# Patient Record
Sex: Male | Born: 1980 | Race: White | Hispanic: No | Marital: Single | Smoking: Current every day smoker
Health system: Southern US, Community
[De-identification: ages and names within clinical notes are randomized; demographics above are authoritative.]

## PROBLEM LIST (undated history)

## (undated) DIAGNOSIS — R042 Hemoptysis: Secondary | ICD-10-CM

## (undated) DIAGNOSIS — J449 Chronic obstructive pulmonary disease, unspecified: Secondary | ICD-10-CM

## (undated) DIAGNOSIS — Z8614 Personal history of Methicillin resistant Staphylococcus aureus infection: Secondary | ICD-10-CM

## (undated) DIAGNOSIS — Z8489 Family history of other specified conditions: Secondary | ICD-10-CM

## (undated) HISTORY — PX: HERNIA REPAIR: SHX51

---

## 2003-05-14 DIAGNOSIS — Z8614 Personal history of Methicillin resistant Staphylococcus aureus infection: Secondary | ICD-10-CM

## 2003-05-14 HISTORY — DX: Personal history of Methicillin resistant Staphylococcus aureus infection: Z86.14

## 2017-06-25 ENCOUNTER — Encounter: Payer: Self-pay | Admitting: Emergency Medicine

## 2017-06-25 ENCOUNTER — Inpatient Hospital Stay
Admission: EM | Admit: 2017-06-25 | Discharge: 2017-07-01 | DRG: 194 | Disposition: A | Discharge status: Home / Self Care | Payer: Medicaid - Out of State | Attending: Internal Medicine | Admitting: Internal Medicine

## 2017-06-25 ENCOUNTER — Other Ambulatory Visit: Payer: Self-pay

## 2017-06-25 ENCOUNTER — Emergency Department: Payer: Medicaid - Out of State

## 2017-06-25 DIAGNOSIS — F329 Major depressive disorder, single episode, unspecified: Secondary | ICD-10-CM | POA: Diagnosis present

## 2017-06-25 DIAGNOSIS — F17210 Nicotine dependence, cigarettes, uncomplicated: Secondary | ICD-10-CM | POA: Diagnosis present

## 2017-06-25 DIAGNOSIS — J189 Pneumonia, unspecified organism: Principal | ICD-10-CM | POA: Diagnosis present

## 2017-06-25 DIAGNOSIS — Z575 Occupational exposure to toxic agents in other industries: Secondary | ICD-10-CM

## 2017-06-25 DIAGNOSIS — Z825 Family history of asthma and other chronic lower respiratory diseases: Secondary | ICD-10-CM

## 2017-06-25 DIAGNOSIS — J439 Emphysema, unspecified: Secondary | ICD-10-CM | POA: Diagnosis present

## 2017-06-25 DIAGNOSIS — Z7951 Long term (current) use of inhaled steroids: Secondary | ICD-10-CM

## 2017-06-25 DIAGNOSIS — F1510 Other stimulant abuse, uncomplicated: Secondary | ICD-10-CM | POA: Diagnosis present

## 2017-06-25 DIAGNOSIS — R042 Hemoptysis: Secondary | ICD-10-CM | POA: Diagnosis present

## 2017-06-25 DIAGNOSIS — Z79899 Other long term (current) drug therapy: Secondary | ICD-10-CM

## 2017-06-25 HISTORY — DX: Chronic obstructive pulmonary disease, unspecified: J44.9

## 2017-06-25 LAB — COMPREHENSIVE METABOLIC PANEL
ALT: 14 U/L — ABNORMAL LOW (ref 17–63)
AST: 16 U/L (ref 15–41)
Albumin: 3.7 g/dL (ref 3.5–5.0)
Alkaline Phosphatase: 64 U/L (ref 38–126)
Anion gap: 9 (ref 5–15)
BUN: 14 mg/dL (ref 6–20)
CHLORIDE: 108 mmol/L (ref 101–111)
CO2: 26 mmol/L (ref 22–32)
CREATININE: 0.68 mg/dL (ref 0.61–1.24)
Calcium: 9.2 mg/dL (ref 8.9–10.3)
Glucose, Bld: 91 mg/dL (ref 65–99)
POTASSIUM: 4.4 mmol/L (ref 3.5–5.1)
Sodium: 143 mmol/L (ref 135–145)
TOTAL PROTEIN: 6.6 g/dL (ref 6.5–8.1)
Total Bilirubin: 0.3 mg/dL (ref 0.3–1.2)

## 2017-06-25 LAB — CBC WITH DIFFERENTIAL/PLATELET
Basophils Absolute: 0 10*3/uL (ref 0–0.1)
Basophils Relative: 0 %
EOS PCT: 2 %
Eosinophils Absolute: 0.1 10*3/uL (ref 0–0.7)
HCT: 42.7 % (ref 40.0–52.0)
Hemoglobin: 14.2 g/dL (ref 13.0–18.0)
LYMPHS ABS: 2 10*3/uL (ref 1.0–3.6)
Lymphocytes Relative: 29 %
MCH: 29.8 pg (ref 26.0–34.0)
MCHC: 33.2 g/dL (ref 32.0–36.0)
MCV: 89.8 fL (ref 80.0–100.0)
MONO ABS: 0.4 10*3/uL (ref 0.2–1.0)
MONOS PCT: 6 %
Neutro Abs: 4.3 10*3/uL (ref 1.4–6.5)
Neutrophils Relative %: 63 %
PLATELETS: 263 10*3/uL (ref 150–440)
RBC: 4.76 MIL/uL (ref 4.40–5.90)
RDW: 16 % — AB (ref 11.5–14.5)
WBC: 6.7 10*3/uL (ref 3.8–10.6)

## 2017-06-25 NOTE — ED Triage Notes (Addendum)
Patient ambulatory to triage with steady gait, without difficulty or distress noted; pt reports prod cough of bloody sputum last several days with right rib pain; denies fever

## 2017-06-26 ENCOUNTER — Other Ambulatory Visit: Payer: Self-pay

## 2017-06-26 ENCOUNTER — Encounter: Payer: Self-pay | Admitting: Radiology

## 2017-06-26 ENCOUNTER — Emergency Department: Payer: Medicaid - Out of State

## 2017-06-26 DIAGNOSIS — F17210 Nicotine dependence, cigarettes, uncomplicated: Secondary | ICD-10-CM | POA: Diagnosis present

## 2017-06-26 DIAGNOSIS — F1510 Other stimulant abuse, uncomplicated: Secondary | ICD-10-CM | POA: Diagnosis present

## 2017-06-26 DIAGNOSIS — J439 Emphysema, unspecified: Secondary | ICD-10-CM | POA: Diagnosis present

## 2017-06-26 DIAGNOSIS — F17200 Nicotine dependence, unspecified, uncomplicated: Secondary | ICD-10-CM | POA: Diagnosis not present

## 2017-06-26 DIAGNOSIS — F329 Major depressive disorder, single episode, unspecified: Secondary | ICD-10-CM | POA: Diagnosis present

## 2017-06-26 DIAGNOSIS — Z575 Occupational exposure to toxic agents in other industries: Secondary | ICD-10-CM | POA: Diagnosis not present

## 2017-06-26 DIAGNOSIS — Z825 Family history of asthma and other chronic lower respiratory diseases: Secondary | ICD-10-CM | POA: Diagnosis not present

## 2017-06-26 DIAGNOSIS — J189 Pneumonia, unspecified organism: Secondary | ICD-10-CM | POA: Diagnosis not present

## 2017-06-26 DIAGNOSIS — Z7951 Long term (current) use of inhaled steroids: Secondary | ICD-10-CM | POA: Diagnosis not present

## 2017-06-26 DIAGNOSIS — R042 Hemoptysis: Secondary | ICD-10-CM | POA: Diagnosis present

## 2017-06-26 DIAGNOSIS — J181 Lobar pneumonia, unspecified organism: Secondary | ICD-10-CM | POA: Diagnosis not present

## 2017-06-26 DIAGNOSIS — Z79899 Other long term (current) drug therapy: Secondary | ICD-10-CM | POA: Diagnosis not present

## 2017-06-26 LAB — BASIC METABOLIC PANEL
ANION GAP: 9 (ref 5–15)
BUN: 12 mg/dL (ref 6–20)
CHLORIDE: 105 mmol/L (ref 101–111)
CO2: 27 mmol/L (ref 22–32)
Calcium: 8.9 mg/dL (ref 8.9–10.3)
Creatinine, Ser: 0.77 mg/dL (ref 0.61–1.24)
GFR calc Af Amer: >60 mL/min (ref >60)
GFR calc non Af Amer: >60 mL/min (ref >60)
Glucose, Bld: 140 mg/dL — ABNORMAL HIGH (ref 65–99)
Potassium: 3.9 mmol/L (ref 3.5–5.1)
SODIUM: 141 mmol/L (ref 135–145)

## 2017-06-26 LAB — CBC
HCT: 43.1 % (ref 40.0–52.0)
HEMOGLOBIN: 14.2 g/dL (ref 13.0–18.0)
MCH: 30 pg (ref 26.0–34.0)
MCHC: 33 g/dL (ref 32.0–36.0)
MCV: 90.8 fL (ref 80.0–100.0)
Platelets: 244 10*3/uL (ref 150–440)
RBC: 4.75 MIL/uL (ref 4.40–5.90)
RDW: 16.4 % — ABNORMAL HIGH (ref 11.5–14.5)
WBC: 8.7 10*3/uL (ref 3.8–10.6)

## 2017-06-26 LAB — LACTIC ACID, PLASMA
LACTIC ACID, VENOUS: 0.8 mmol/L (ref 0.5–1.9)
LACTIC ACID, VENOUS: 1.8 mmol/L (ref 0.5–1.9)

## 2017-06-26 LAB — INFLUENZA PANEL BY PCR (TYPE A & B)
INFLBPCR: NEGATIVE
Influenza A By PCR: NEGATIVE

## 2017-06-26 LAB — PROTIME-INR
INR: 0.94
Prothrombin Time: 12.5 seconds (ref 11.4–15.2)

## 2017-06-26 LAB — TROPONIN I: Troponin I: <0.03 ng/mL (ref <0.03)

## 2017-06-26 MED ORDER — HYDROCOD POLST-CPM POLST ER 10-8 MG/5ML PO SUER
5.0000 mL | Freq: Once | ORAL | Status: AC
  Administered 2017-06-26: 5 mL via ORAL
  Filled 2017-06-26: qty 5

## 2017-06-26 MED ORDER — ACETAMINOPHEN 650 MG RE SUPP: 650.0000 mg | Freq: Four times a day (QID) | RECTAL | Status: DC | PRN

## 2017-06-26 MED ORDER — SODIUM CHLORIDE 0.9 % IV SOLN
500.0000 mg | INTRAVENOUS | Status: DC
  Filled 2017-06-26: qty 500

## 2017-06-26 MED ORDER — SODIUM CHLORIDE 0.9 % IV SOLN
1.0000 g | INTRAVENOUS | Status: DC
  Administered 2017-06-26 – 2017-06-30 (×6): 1 g via INTRAVENOUS
  Filled 2017-06-26 (×7): qty 10

## 2017-06-26 MED ORDER — IPRATROPIUM-ALBUTEROL 0.5-2.5 (3) MG/3ML IN SOLN
3.0000 mL | Freq: Four times a day (QID) | RESPIRATORY_TRACT | Status: DC
  Administered 2017-06-26 – 2017-06-28 (×12): 3 mL via RESPIRATORY_TRACT
  Filled 2017-06-26 (×12): qty 3

## 2017-06-26 MED ORDER — DEXTROSE-NACL 5-0.9 % IV SOLN: INTRAVENOUS | Status: DC

## 2017-06-26 MED ORDER — IOPAMIDOL (ISOVUE-370) INJECTION 76%
100.0000 mL | Freq: Once | INTRAVENOUS | Status: AC | PRN
  Administered 2017-06-26: 100 mL via INTRAVENOUS

## 2017-06-26 MED ORDER — SODIUM CHLORIDE 0.9 % IV BOLUS (SEPSIS)
1000.0000 mL | Freq: Once | INTRAVENOUS | Status: AC
  Administered 2017-06-26: 1000 mL via INTRAVENOUS

## 2017-06-26 MED ORDER — ACETAMINOPHEN 325 MG PO TABS: 650.0000 mg | ORAL_TABLET | Freq: Four times a day (QID) | ORAL | Status: DC | PRN

## 2017-06-26 MED ORDER — BUDESONIDE 0.5 MG/2ML IN SUSP
0.5000 mg | Freq: Two times a day (BID) | RESPIRATORY_TRACT | Status: DC
  Administered 2017-06-26 – 2017-07-01 (×10): 0.5 mg via RESPIRATORY_TRACT
  Filled 2017-06-26 (×10): qty 2

## 2017-06-26 MED ORDER — SODIUM CHLORIDE 0.9 % IV SOLN
500.0000 mg | Freq: Once | INTRAVENOUS | Status: AC
  Administered 2017-06-26: 500 mg via INTRAVENOUS
  Filled 2017-06-26: qty 500

## 2017-06-26 MED ORDER — ONDANSETRON HCL 4 MG PO TABS: 4.0000 mg | ORAL_TABLET | Freq: Four times a day (QID) | ORAL | Status: DC | PRN

## 2017-06-26 MED ORDER — METHYLPREDNISOLONE SODIUM SUCC 125 MG IJ SOLR
125.0000 mg | Freq: Once | INTRAMUSCULAR | Status: AC
  Administered 2017-06-26: 125 mg via INTRAVENOUS
  Filled 2017-06-26: qty 2

## 2017-06-26 MED ORDER — AZITHROMYCIN 500 MG PO TABS
500.0000 mg | ORAL_TABLET | Freq: Every day | ORAL | Status: AC
  Administered 2017-06-27 – 2017-06-28 (×2): 500 mg via ORAL
  Filled 2017-06-26 (×2): qty 1

## 2017-06-26 MED ORDER — ONDANSETRON HCL 4 MG/2ML IJ SOLN: 4.0000 mg | Freq: Four times a day (QID) | INTRAMUSCULAR | Status: DC | PRN

## 2017-06-26 NOTE — Progress Notes (Addendum)
The patient has hemoptysis with some fresh blood. He denies any shortness of breath or wheezing or chest pain. Vital signs are stable. Physical examinations is unremarkable. 1.  Hemoptysis 2.  Emphysema 3.  Right lung pneumonia 4.  Substance abuse Continue Zithromax and Rocephin, follow-up TB test and pulmonary consult. Tobacco abuse.  Smoking cessation was counseled for 3-4 minutes.  Discussed with the patient and RN.  Time spent about 25 minutes.

## 2017-06-26 NOTE — ED Notes (Signed)
Pt was moved to isolation room #11

## 2017-06-26 NOTE — Progress Notes (Signed)
Pharmacy Antibiotic Note  Brian Sloan is a 37 y.o. male admitted on 06/25/2017 with pneumonia.  Pharmacy has been consulted for ceftriaxone dosing.  Plan: Ceftriaxone 1g IV daily  Height: 5\' 8"  (172.7 cm) Weight: 130 lb (59 kg) IBW/kg (Calculated) : 68.4  Temp (24hrs), Avg:97.7 F (36.5 C), Min:97.7 F (36.5 C), Max:97.7 F (36.5 C)  Recent Labs  Lab 06/25/17 2240 06/26/17 0156  WBC 6.7  --   CREATININE 0.68  --   LATICACIDVEN  --  0.8    Estimated Creatinine Clearance: 108.6 mL/min (by C-G formula based on SCr of 0.68 mg/dL).    No Known Allergies   Thank you for allowing pharmacy to be a part of this patient's care.  Thomasene Rippleavid Salahuddin Arismendez, PharmD, BCPS Clinical Pharmacist 06/26/2017
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
## 2017-06-27 LAB — HIV ANTIBODY (ROUTINE TESTING W REFLEX): HIV SCREEN 4TH GENERATION: NONREACTIVE

## 2017-06-27 LAB — EXPECTORATED SPUTUM ASSESSMENT W REFEX TO RESP CULTURE: SPECIAL REQUESTS: NORMAL

## 2017-06-27 LAB — EXPECTORATED SPUTUM ASSESSMENT W GRAM STAIN, RFLX TO RESP C

## 2017-06-27 MED ORDER — GABAPENTIN 300 MG PO CAPS
300.0000 mg | ORAL_CAPSULE | Freq: Two times a day (BID) | ORAL | Status: DC
  Administered 2017-06-27 – 2017-07-01 (×9): 300 mg via ORAL
  Filled 2017-06-27 (×9): qty 1

## 2017-06-27 MED ORDER — MOMETASONE FURO-FORMOTEROL FUM 200-5 MCG/ACT IN AERO
2.0000 | INHALATION_SPRAY | Freq: Two times a day (BID) | RESPIRATORY_TRACT | Status: DC
  Administered 2017-06-27 – 2017-07-01 (×9): 2 via RESPIRATORY_TRACT
  Filled 2017-06-27: qty 8.8

## 2017-06-27 MED ORDER — MONTELUKAST SODIUM 10 MG PO TABS
10.0000 mg | ORAL_TABLET | Freq: Every day | ORAL | Status: DC
  Administered 2017-06-27 – 2017-07-01 (×5): 10 mg via ORAL
  Filled 2017-06-27 (×5): qty 1

## 2017-06-27 MED ORDER — FLUOXETINE HCL 20 MG PO CAPS
20.0000 mg | ORAL_CAPSULE | ORAL | Status: DC
  Administered 2017-06-27 – 2017-07-01 (×5): 20 mg via ORAL
  Filled 2017-06-27 (×5): qty 1

## 2017-06-27 NOTE — Progress Notes (Signed)
Pharmacy Antibiotic Note  Brian Sloan is a 37 y.o. male admitted on 06/25/2017 with pneumonia.  Pharmacy has been consulted for ceftriaxone dosing. Patient also receiving Azithromycin.   Plan: Continue Ceftriaxone 1g IV daily Recommend Azithromycin 500mg  x 3 days. Stop date 2/16.   Height: 5\' 8"  (172.7 cm) Weight: 130 lb (59 kg) IBW/kg (Calculated) : 68.4  Temp (24hrs), Avg:97.9 F (36.6 C), Min:97.8 F (36.6 C), Max:98.1 F (36.7 C)  Recent Labs  Lab 06/25/17 2240 06/26/17 0156 06/26/17 0829  WBC 6.7  --  8.7  CREATININE 0.68  --  0.77  LATICACIDVEN  --  0.8 1.8    Estimated Creatinine Clearance: 108.6 mL/min (by C-G formula based on SCr of 0.77 mg/dL).    No Known Allergies  Thank you for allowing pharmacy to be a part of this patient's care.  Gardner CandleSheema M Kaynen Minner, PharmD, BCPS Clinical Pharmacist 06/27/2017 8:17 AM

## 2017-06-27 NOTE — Progress Notes (Signed)
[facility] [place] [facility]   PATIENT NAME: Brian Sloan    MR#:  [id number]  DATE OF BIRTH:  03/13/1981  SUBJECTIVE:  CHIEF COMPLAINT:   Chief Complaint  Patient presents with  . Hemoptysis   Hemoptysis with blood clot this morning. REVIEW OF SYSTEMS:  Review of Systems  Constitutional: Negative for chills, fever and malaise/fatigue.  HENT: Negative for sore throat.   Eyes: Negative for blurred vision and double vision.  Respiratory: Positive for cough and hemoptysis. Negative for shortness of breath, wheezing and stridor.   Cardiovascular: Negative for chest pain, palpitations, orthopnea and leg swelling.  Gastrointestinal: Negative for abdominal pain, blood in stool, diarrhea, melena, nausea and vomiting.  Genitourinary: Negative for dysuria, flank pain and hematuria.  Musculoskeletal: Negative for back pain and joint pain.  Skin: Negative for rash.  Neurological: Negative for dizziness, sensory change, focal weakness, seizures, loss of consciousness, weakness and headaches.  Endo/Heme/Allergies: Negative for polydipsia.  Psychiatric/Behavioral: Negative for depression. The patient is not nervous/anxious.     DRUG ALLERGIES:  No Known Allergies VITALS:  Blood pressure 128/76, pulse 88, temperature 98.7 F (37.1 C), temperature source Oral, resp. rate 18, height 5\' 8"  (1.727 m), weight 130 lb (59 kg), SpO2 100 %. PHYSICAL EXAMINATION:  Physical Exam  Constitutional: He is oriented to person, place, and time and well-developed, well-nourished, and in no distress.  HENT:  Head: Normocephalic.  Mouth/Throat: Oropharynx is clear and moist.  Eyes: Conjunctivae and EOM are normal. Pupils are equal, round, and reactive to light. No scleral icterus.  Neck: Normal range of motion. Neck supple. No JVD present. No tracheal deviation present.  Cardiovascular: Normal rate, regular rhythm and normal heart sounds. Exam reveals no gallop.  No murmur  heard. Pulmonary/Chest: Effort normal and breath sounds normal. No respiratory distress. He has no wheezes. He has no rales.  Abdominal: Soft. Bowel sounds are normal. He exhibits no distension. There is no tenderness. There is no rebound.  Musculoskeletal: Normal range of motion. He exhibits no edema or tenderness.  Neurological: He is alert and oriented to person, place, and time. No cranial nerve deficit.  Skin: No rash noted. No erythema.  Psychiatric: Affect normal.   LABORATORY PANEL:  Male CBC Recent Labs  Lab 06/26/17 0829  WBC 8.7  HGB 14.2  HCT 43.1  PLT 244   ------------------------------------------------------------------------------------------------------------------ Chemistries  Recent Labs  Lab 06/25/17 2240 06/26/17 0829  NA 143 141  K 4.4 3.9  CL 108 105  CO2 26 27  GLUCOSE 91 140*  BUN 14 12  CREATININE 0.68 0.77  CALCIUM 9.2 8.9  AST 16  --   ALT 14*  --   ALKPHOS 64  --   BILITOT 0.3  --    RADIOLOGY:  No results found. ASSESSMENT AND PLAN:   The patient has hemoptysis with some fresh blood. 1.  Hemoptysis 2.  Emphysema 3.  Right lung pneumonia Continue Zithromax and Rocephin, follow-up TB test, ID and pulmonary consult. Tobacco abuse.  Smoking cessation was counseled for 3-4 minutes.  All the records are reviewed and case discussed with Care Management/Social Worker. Management plans discussed with the patient, family and they are in agreement.  CODE STATUS: Full Code  TOTAL TIME TAKING CARE OF THIS PATIENT: 22 minutes.   More than 50% of the time was spent in counseling/coordination of care: YES  POSSIBLE D/C IN 1-2 DAYS, DEPENDING ON CLINICAL CONDITION.   Shaune Pollack M.D on 06/27/2017 at 2:34  PM  Between 7am to 6pm - Pager - [phone]  After 6pm go to [email or web address] - Therapist, nutritionalpassword [email or web address]  [facility]

## 2017-06-27 NOTE — Progress Notes (Signed)
Medical release form signed by pt/  [facility]
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
## 2017-06-28 DIAGNOSIS — R042 Hemoptysis: Secondary | ICD-10-CM

## 2017-06-28 DIAGNOSIS — J181 Lobar pneumonia, unspecified organism: Secondary | ICD-10-CM | POA: Diagnosis not present

## 2017-06-28 DIAGNOSIS — F17200 Nicotine dependence, unspecified, uncomplicated: Secondary | ICD-10-CM | POA: Diagnosis not present

## 2017-06-28 LAB — RESPIRATORY PANEL BY PCR
ADENOVIRUS-RVPPCR: NOT DETECTED
Bordetella pertussis: NOT DETECTED
CHLAMYDOPHILA PNEUMONIAE-RVPPCR: NOT DETECTED
CORONAVIRUS HKU1-RVPPCR: NOT DETECTED
Coronavirus 229E: NOT DETECTED
Coronavirus NL63: NOT DETECTED
Coronavirus OC43: NOT DETECTED
INFLUENZA A-RVPPCR: NOT DETECTED
Influenza B: NOT DETECTED
MYCOPLASMA PNEUMONIAE-RVPPCR: NOT DETECTED
Metapneumovirus: NOT DETECTED
Parainfluenza Virus 1: NOT DETECTED
Parainfluenza Virus 2: NOT DETECTED
Parainfluenza Virus 3: NOT DETECTED
Parainfluenza Virus 4: NOT DETECTED
Respiratory Syncytial Virus: NOT DETECTED
Rhinovirus / Enterovirus: NOT DETECTED

## 2017-06-28 NOTE — Consult Note (Signed)
Pulmonary Critical Care  Initial Consult Note  Brian Footsaron Thayne ZOX:[id number] DOB: 04-05-1981 DOA: 06/25/2017  Referring physician: Dr Tobi BastosPyreddy  Chief Complaint: Hemoptysis  HPI: Brian Sloan is a 37 y.o. male  With history of substance use and chronic pulmonary disease related to a PTX as a chaild presents to the ED with cough and hemoptysis. Patient apparently moved from [place]. He states that he has been incarcerated for a short while back last year. Patient notes cough had been going of for a few days prior to admission. He has noted it is improving now though. He had a CXR and CT scan done showing some chronic changes along with some adenopathy. Patient has been placed in isolation for AFB concerns. Right now he states that the hemoptysis has resolved. He is a smoker.  Review of Systems:  Constitutional:  No weight loss, night sweats, Fevers, chills, fatigue.  HEENT:  No headaches, nasal congestion, post nasal drip,  Cardio-vascular:  No chest pain, Orthopnea, PND, swelling in lower extremities, anasarca, dizziness, palpitations  GI:  No heartburn, indigestion, abdominal pain, nausea, vomiting, diarrhea  Resp:  +shortness of breath +productive cough, +coughing up of blood.No wheezing Skin:  no rash or lesions.  Musculoskeletal:  No joint pain or swelling.   Remainder ROS performed and is unremarkable other than noted in HPI  Past Medical History:  Diagnosis Date  . COPD (chronic obstructive pulmonary disease) (HCC)    Past Surgical History:  Procedure Laterality Date  . HERNIA REPAIR     Social History:  reports that he has been smoking.  he has never used smokeless tobacco. He reports that he drinks alcohol. He reports that he uses drugs. Drug: Methamphetamines.  No Known Allergies  Family History  Problem Relation Age of Onset  . COPD Mother   . Alcoholism Mother     Prior to Admission medications   Medication Sig Start Date End Date Taking? Authorizing Provider   FLUoxetine (PROZAC) 20 MG capsule Take 1 capsule by mouth every morning.    Yes [provider]  Fluticasone-Salmeterol (ADVAIR) 250-50 MCG/DOSE AEPB Inhale 1 puff into the lungs 2 (two) times daily.   Yes [provider]  gabapentin (NEURONTIN) 300 MG capsule Take 300 mg by mouth 2 (two) times daily.   Yes [provider]  montelukast (SINGULAIR) 10 MG tablet Take 10 mg by mouth daily.   Yes [provider]   Physical Exam: Vitals:   06/28/17 0221 06/28/17 0424 06/28/17 0930 06/28/17 1358  BP:  111/75 126/62 111/73  Pulse:  79 82 92  Resp:  16 16 18   Temp:  98.6 F (37 C) 98.7 F (37.1 C) 98.6 F (37 C)  TempSrc:   Oral   SpO2: 99% 100% 100% 98%  Weight:      Height:        Wt Readings from Last 3 Encounters:  06/25/17 130 lb (59 kg)    General:  Appears calm and comfortable Eyes: PERRL, normal lids, irises & conjunctiva ENT: grossly normal hearing, lips & tongue Neck: no LAD, masses or thyromegaly Cardiovascular: RRR, no m/r/g. No LE edema. Respiratory: CTA bilaterally, no w/r/r.       Normal respiratory effort. Abdomen: soft, nontender Skin: no rash or induration seen on limited exam Musculoskeletal: grossly normal tone BUE/BLE Psychiatric: grossly normal mood and affect Neurologic: grossly non-focal.          Labs on Admission:  Basic Metabolic Panel: Recent Labs  Lab 06/25/17 2240 06/26/17  0829  NA 143 141  K 4.4 3.9  CL 108 105  CO2 26 27  GLUCOSE 91 140*  BUN 14 12  CREATININE 0.68 0.77  CALCIUM 9.2 8.9   Liver Function Tests: Recent Labs  Lab 06/25/17 2240  AST 16  ALT 14*  ALKPHOS 64  BILITOT 0.3  PROT 6.6  ALBUMIN 3.7   No results for input(s): LIPASE, AMYLASE in the last 168 hours. No results for input(s): AMMONIA in the last 168 hours. CBC: Recent Labs  Lab 06/25/17 2240 06/26/17 0829  WBC 6.7 8.7  NEUTROABS 4.3  --   HGB 14.2 14.2  HCT 42.7 43.1  MCV 89.8 90.8  PLT 263 244   Cardiac  Enzymes: Recent Labs  Lab 06/25/17 2240  TROPONINI <0.03    BNP (last 3 results) No results for input(s): BNP in the last 8760 hours.  ProBNP (last 3 results) No results for input(s): PROBNP in the last 8760 hours.  CBG: No results for input(s): GLUCAP in the last 168 hours.  Radiological Exams on Admission: No results found.  EKG: Independently reviewed.  Assessment/Plan Active Problems:   Hemoptysis   1. Hemoptysis clinically is improving may likely be related to infectious process in this case. I would suggest that he complete abx as ordered. Check AFB as ordered needs follow up as outpatient and we can repeat his imaging studies to reassess and if needed would do a bronchoscopy at that point if there is no improvement. Also need records from [place] 2. COPD continue with present management 3. Smoker advised to stop smoking  Code Status: full code   Family Communication: none  Disposition Plan: home   Time spent:  I have personally obtained a history, examined the patient, evaluated laboratory and imaging results, formulated the assessment and plan and placed orders.  The Patient requires high complexity decision making for assessment and support. Total Time Spent   Yevonne Pax, MD [facility] Pulmonary Critical Care Medicine Sleep Medicine

## 2017-06-28 NOTE — Progress Notes (Signed)
Patient ID: Brian Sloan, male   DOB: [birth year], 37 y.o.   MRN: [id number]  [employer] PROGRESS NOTE  Hason Ofarrell WUJ:[id number] DOB: 07/13/1980 DOA: 06/25/2017 PCP: Patient, No Pcp Per  HPI/Subjective: Patient feeling better.  Not coughing up blood anymore.  Still with a little short of breath and coughing.  Objective: Vitals:   06/28/17 0930 06/28/17 1358  BP: 126/62 111/73  Pulse: 82 92  Resp: 16 18  Temp: 98.7 F (37.1 C) 98.6 F (37 C)  SpO2: 100% 98%   No intake or output data in the 24 hours ending 06/28/17 1459 Filed Weights   06/25/17 2241  Weight: 59 kg (130 lb)    ROS: Review of Systems  Constitutional: Negative for chills and fever.  Eyes: Negative for blurred vision.  Respiratory: Positive for cough and shortness of breath.   Cardiovascular: Negative for chest pain.  Gastrointestinal: Negative for abdominal pain, constipation, diarrhea, nausea and vomiting.  Genitourinary: Negative for dysuria.  Musculoskeletal: Negative for joint pain.  Neurological: Negative for dizziness and headaches.   Exam: Physical Exam  Constitutional: He is oriented to person, place, and time.  HENT:  Nose: No mucosal edema.  Mouth/Throat: No oropharyngeal exudate or posterior oropharyngeal edema.  Eyes: Conjunctivae, EOM and lids are normal. Pupils are equal, round, and reactive to light.  Neck: No JVD present. Carotid bruit is not present. No edema present. No thyroid mass and no thyromegaly present.  Cardiovascular: S1 normal and S2 normal. Exam reveals no gallop.  No murmur heard. Pulses:      Dorsalis pedis pulses are 2+ on the right side, and 2+ on the left side.  Respiratory: No respiratory distress. He has decreased breath sounds in the right lower field and the left lower field. He has no wheezes. He has no rhonchi. He has no rales.  GI: Soft. Bowel sounds are normal. There is no tenderness.  Musculoskeletal:       Right ankle: He exhibits no swelling.   Left ankle: He exhibits no swelling.  Lymphadenopathy:    He has no cervical adenopathy.  Neurological: He is alert and oriented to person, place, and time. No cranial nerve deficit.  Skin: Skin is warm. No rash noted. Nails show no clubbing.  Psychiatric: He has a normal mood and affect.      Data Reviewed: Basic Metabolic Panel: Recent Labs  Lab 06/25/17 2240 06/26/17 0829  NA 143 141  K 4.4 3.9  CL 108 105  CO2 26 27  GLUCOSE 91 140*  BUN 14 12  CREATININE 0.68 0.77  CALCIUM 9.2 8.9   Liver Function Tests: Recent Labs  Lab 06/25/17 2240  AST 16  ALT 14*  ALKPHOS 64  BILITOT 0.3  PROT 6.6  ALBUMIN 3.7   CBC: Recent Labs  Lab 06/25/17 2240 06/26/17 0829  WBC 6.7 8.7  NEUTROABS 4.3  --   HGB 14.2 14.2  HCT 42.7 43.1  MCV 89.8 90.8  PLT 263 244   Cardiac Enzymes: Recent Labs  Lab 06/25/17 2240  TROPONINI <0.03     Recent Results (from the past 240 hour(s))  Culture, blood (routine x 2)     Status: None (Preliminary result)   Collection Time: 06/26/17  1:56 AM  Result Value Ref Range Status   Specimen Description BLOOD LEFT AC  Final   Special Requests   Final    BOTTLES DRAWN AEROBIC AND ANAEROBIC Blood Culture results may not be optimal due to an excessive volume of  blood received in culture bottles   Culture   Final    NO GROWTH 2 DAYS Performed at [facility], [address] [place], [place] [address]    Report Status PENDING  Incomplete  Culture, blood (routine x 2)     Status: None (Preliminary result)   Collection Time: 06/26/17  1:56 AM  Result Value Ref Range Status   Specimen Description BLOOD LEFT FA  Final   Special Requests   Final    BOTTLES DRAWN AEROBIC AND ANAEROBIC Blood Culture adequate volume   Culture   Final    NO GROWTH 2 DAYS Performed at [facility], [address] [place], [place] [address]    Report Status PENDING  Incomplete  Respiratory Panel by PCR     Status: None   Collection Time:  06/27/17  4:25 PM  Result Value Ref Range Status   Adenovirus NOT DETECTED NOT DETECTED Final   Coronavirus 229E NOT DETECTED NOT DETECTED Final   Coronavirus HKU1 NOT DETECTED NOT DETECTED Final   Coronavirus NL63 NOT DETECTED NOT DETECTED Final   Coronavirus OC43 NOT DETECTED NOT DETECTED Final   Metapneumovirus NOT DETECTED NOT DETECTED Final   Rhinovirus / Enterovirus NOT DETECTED NOT DETECTED Final   Influenza A NOT DETECTED NOT DETECTED Final   Influenza B NOT DETECTED NOT DETECTED Final   Parainfluenza Virus 1 NOT DETECTED NOT DETECTED Final   Parainfluenza Virus 2 NOT DETECTED NOT DETECTED Final   Parainfluenza Virus 3 NOT DETECTED NOT DETECTED Final   Parainfluenza Virus 4 NOT DETECTED NOT DETECTED Final   Respiratory Syncytial Virus NOT DETECTED NOT DETECTED Final   Bordetella pertussis NOT DETECTED NOT DETECTED Final   Chlamydophila pneumoniae NOT DETECTED NOT DETECTED Final   Mycoplasma pneumoniae NOT DETECTED NOT DETECTED Final    Comment: Performed at [facility], [address] [place], [place] [address]  Culture, expectorated sputum-assessment     Status: None   Collection Time: 06/27/17  4:32 PM  Result Value Ref Range Status   Specimen Description EXPECTORATED SPUTUM  Final   Special Requests Normal  Final   Sputum evaluation   Final    THIS SPECIMEN IS ACCEPTABLE FOR SPUTUM CULTURE Performed at [facility], [address] [place], [place] [address]    Report Status 06/27/2017 FINAL  Final  Culture, respiratory (NON-Expectorated)     Status: None (Preliminary result)   Collection Time: 06/27/17  4:32 PM  Result Value Ref Range Status   Specimen Description   Final    EXPECTORATED SPUTUM Performed at [facility], [address] [place], [place] [address]    Special Requests   Final    Normal Reflexed from [id number] Performed at [facility], [address] [place], [place] [address]    Gram Stain   Final    FEW WBC  PRESENT, PREDOMINANTLY PMN RARE SQUAMOUS EPITHELIAL CELLS PRESENT FEW GRAM POSITIVE COCCI IN PAIRS RARE GRAM NEGATIVE RODS RARE BUDDING YEAST SEEN Performed at [facility], [address] [place], [place] [address]    Culture PENDING  Incomplete   Report Status PENDING  Incomplete      Scheduled Meds: . budesonide (PULMICORT) nebulizer solution  0.5 mg Nebulization BID  . FLUoxetine  20 mg Oral BH-q7a  . gabapentin  300 mg Oral BID  . ipratropium-albuterol  3 mL Nebulization Q6H  . mometasone-formoterol  2 puff Inhalation BID  . montelukast  10 mg Oral  Daily   Continuous Infusions: . cefTRIAXone (ROCEPHIN)  IV 1 g (06/27/17 1721)    Assessment/Plan:  1. Right lung pneumonia.  Patient finished Zithromax course and is on Rocephin. 2. Hemoptysis.  Patient in isolation and AFBs are sent off.  Awaiting results. 3. History of COPD 4. History of occupational exposure 5. Tobacco abuse. 6. Depression on fluoxetine 7. Abnormal CT scan of the chest  Code Status:     Code Status Orders  (From admission, onward)        Start     Ordered   06/26/17 0817  Full code  Continuous     06/26/17 0817    Code Status History    Date Active Date Inactive Code Status Order ID Comments User Context   This patient has a current code status but no historical code status.      Disposition Plan: Can go home once AFBs are negative  Consultants:  Infectious disease  Antibiotics:  Rocephin  Time spent: 25 minutes  Aveleen Nevers [employer]

## 2017-06-29 LAB — ACID FAST SMEAR (AFB, MYCOBACTERIA)

## 2017-06-29 LAB — ACID FAST SMEAR (AFB): ACID FAST SMEAR - AFSCU2: NEGATIVE

## 2017-06-29 MED ORDER — IPRATROPIUM-ALBUTEROL 0.5-2.5 (3) MG/3ML IN SOLN
3.0000 mL | Freq: Three times a day (TID) | RESPIRATORY_TRACT | Status: DC
  Administered 2017-06-29 – 2017-07-01 (×8): 3 mL via RESPIRATORY_TRACT
  Filled 2017-06-29 (×8): qty 3

## 2017-06-29 MED ORDER — NICOTINE 21 MG/24HR TD PT24: 21.0000 mg | MEDICATED_PATCH | Freq: Every day | TRANSDERMAL | Status: DC

## 2017-06-29 MED ORDER — NICOTINE 21 MG/24HR TD PT24
21.0000 mg | MEDICATED_PATCH | Freq: Every day | TRANSDERMAL | Status: DC
  Administered 2017-06-29 – 2017-07-01 (×3): 21 mg via TRANSDERMAL
  Filled 2017-06-29 (×3): qty 1

## 2017-06-29 MED ORDER — IPRATROPIUM-ALBUTEROL 0.5-2.5 (3) MG/3ML IN SOLN: 3.0000 mL | Freq: Four times a day (QID) | RESPIRATORY_TRACT | Status: DC | PRN

## 2017-06-29 NOTE — Progress Notes (Signed)
Pt requested IV out this morning due to discomfort. Upon assessment the IV site was found to be red, edematous, and tender to touch. It is not hot. After IV removal, site was reassessed this afternoon and it remains red, edematous, and tender to touch. Dr. Renae GlossWieting notified and he stated to apply ice to the are for 20mins Q 4 hours. He stated that he would assess the area tomorrow to determine if additional antibiotics would be needed.

## 2017-06-29 NOTE — Progress Notes (Signed)
Patient ID: Brian Footsaron Sloan, male   DOB: 11-17-1980, 37 y.o.   MRN: [id number]  [employer] PROGRESS NOTE  Brian Sloan MVH:[id number] DOB: 11-17-1980 DOA: 06/25/2017 PCP: Patient, No Pcp Per  HPI/Subjective: Patient feels okay.  Offers no complaints.  Breathing comfortably.  Less cough.  No coughing up blood.  Objective: Vitals:   06/29/17 1100 06/29/17 1325  BP: 125/78 130/68  Pulse: 85 88  Resp: 16 18  Temp: 97.8 F (36.6 C) 98.4 F (36.9 C)  SpO2: 100% 100%    Intake/Output Summary (Last 24 hours) at 06/29/2017 1455 Last data filed at 06/28/2017 1700 Gross per 24 hour  Intake 240 ml  Output -  Net 240 ml   Filed Weights   06/25/17 2241  Weight: 59 kg (130 lb)    ROS: Review of Systems  Constitutional: Negative for chills and fever.  Eyes: Negative for blurred vision.  Respiratory: Positive for cough. Negative for shortness of breath.   Cardiovascular: Negative for chest pain.  Gastrointestinal: Negative for abdominal pain, constipation, diarrhea, nausea and vomiting.  Genitourinary: Negative for dysuria.  Musculoskeletal: Negative for joint pain.  Neurological: Negative for dizziness and headaches.   Exam: Physical Exam  Constitutional: He is oriented to person, place, and time.  HENT:  Nose: No mucosal edema.  Mouth/Throat: No oropharyngeal exudate or posterior oropharyngeal edema.  Eyes: Conjunctivae, EOM and lids are normal. Pupils are equal, round, and reactive to light.  Neck: No JVD present. Carotid bruit is not present. No edema present. No thyroid mass and no thyromegaly present.  Cardiovascular: S1 normal and S2 normal. Exam reveals no gallop.  No murmur heard. Pulses:      Dorsalis pedis pulses are 2+ on the right side, and 2+ on the left side.  Respiratory: No respiratory distress. He has decreased breath sounds in the right lower field and the left lower field. He has no wheezes. He has no rhonchi. He has no rales.  GI: Soft. Bowel sounds are  normal. There is no tenderness.  Musculoskeletal:       Right ankle: He exhibits no swelling.       Left ankle: He exhibits no swelling.  Lymphadenopathy:    He has no cervical adenopathy.  Neurological: He is alert and oriented to person, place, and time. No cranial nerve deficit.  Skin: Skin is warm. No rash noted. Nails show no clubbing.  Psychiatric: He has a normal mood and affect.      Data Reviewed: Basic Metabolic Panel: Recent Labs  Lab 06/25/17 2240 06/26/17 0829  NA 143 141  K 4.4 3.9  CL 108 105  CO2 26 27  GLUCOSE 91 140*  BUN 14 12  CREATININE 0.68 0.77  CALCIUM 9.2 8.9   Liver Function Tests: Recent Labs  Lab 06/25/17 2240  AST 16  ALT 14*  ALKPHOS 64  BILITOT 0.3  PROT 6.6  ALBUMIN 3.7   CBC: Recent Labs  Lab 06/25/17 2240 06/26/17 0829  WBC 6.7 8.7  NEUTROABS 4.3  --   HGB 14.2 14.2  HCT 42.7 43.1  MCV 89.8 90.8  PLT 263 244   Cardiac Enzymes: Recent Labs  Lab 06/25/17 2240  TROPONINI <0.03     Recent Results (from the past 240 hour(s))  Culture, blood (routine x 2)     Status: None (Preliminary result)   Collection Time: 06/26/17  1:56 AM  Result Value Ref Range Status   Specimen Description [facility]LOOD LEFT [facility]  Final   Special  Requests   Final    BOTTLES DRAWN AEROBIC AND ANAEROBIC Blood Culture results may not be optimal due to an excessive volume of blood received in culture bottles   Culture   Final    NO GROWTH 3 DAYS Performed at [facility], [address] [place], [place] [address]    Report Status PENDING  Incomplete  Culture, blood (routine x 2)     Status: None (Preliminary result)   Collection Time: 06/26/17  1:56 AM  Result Value Ref Range Status   Specimen Description BLOOD LEFT FA  Final   Special Requests   Final    BOTTLES DRAWN AEROBIC AND ANAEROBIC Blood Culture adequate volume   Culture   Final    NO GROWTH 3 DAYS Performed at [facility], [address] [place], [place] [address]     Report Status PENDING  Incomplete  Respiratory Panel by PCR     Status: None   Collection Time: 06/27/17  4:25 PM  Result Value Ref Range Status   Adenovirus NOT DETECTED NOT DETECTED Final   Coronavirus 229E NOT DETECTED NOT DETECTED Final   Coronavirus HKU1 NOT DETECTED NOT DETECTED Final   Coronavirus NL63 NOT DETECTED NOT DETECTED Final   Coronavirus OC43 NOT DETECTED NOT DETECTED Final   Metapneumovirus NOT DETECTED NOT DETECTED Final   Rhinovirus / Enterovirus NOT DETECTED NOT DETECTED Final   Influenza A NOT DETECTED NOT DETECTED Final   Influenza B NOT DETECTED NOT DETECTED Final   Parainfluenza Virus 1 NOT DETECTED NOT DETECTED Final   Parainfluenza Virus 2 NOT DETECTED NOT DETECTED Final   Parainfluenza Virus 3 NOT DETECTED NOT DETECTED Final   Parainfluenza Virus 4 NOT DETECTED NOT DETECTED Final   Respiratory Syncytial Virus NOT DETECTED NOT DETECTED Final   Bordetella pertussis NOT DETECTED NOT DETECTED Final   Chlamydophila pneumoniae NOT DETECTED NOT DETECTED Final   Mycoplasma pneumoniae NOT DETECTED NOT DETECTED Final    Comment: Performed at [facility], [address], [place], [place] [address]  Culture, expectorated sputum-assessment     Status: None   Collection Time: 06/27/17  4:32 PM  Result Value Ref Range Status   Specimen Description EXPECTORATED SPUTUM  Final   Special Requests Normal  Final   Sputum evaluation   Final    THIS SPECIMEN IS ACCEPTABLE FOR SPUTUM CULTURE Performed at [facility], [address] [place], [place] [address]    Report Status 06/27/2017 FINAL  Final  Culture, respiratory (NON-Expectorated)     Status: None (Preliminary result)   Collection Time: 06/27/17  4:32 PM  Result Value Ref Range Status   Specimen Description   Final    EXPECTORATED SPUTUM Performed at [facility], [address], [place], [place] [address]    Special Requests   Final    Normal Reflexed from [id number] Performed at  [facility], [address] [place], [place] [address]    Gram Stain   Final    FEW WBC PRESENT, PREDOMINANTLY PMN RARE SQUAMOUS EPITHELIAL CELLS PRESENT FEW GRAM POSITIVE COCCI IN PAIRS RARE GRAM NEGATIVE RODS RARE BUDDING YEAST SEEN    Culture   Final    CULTURE REINCUBATED FOR BETTER GROWTH Performed at [facility], [address], [place], [place] [address]    Report Status PENDING  Incomplete      Scheduled Meds: . budesonide (PULMICORT) nebulizer solution  0.5 mg Nebulization BID  . FLUoxetine  20  mg Oral BH-q7a  . gabapentin  300 mg Oral BID  . ipratropium-albuterol  3 mL Nebulization TID  . mometasone-formoterol  2 puff Inhalation BID  . montelukast  10 mg Oral Daily  . nicotine  21 mg Transdermal Daily   Continuous Infusions: . cefTRIAXone (ROCEPHIN)  IV Stopped (06/28/17 1856)    Assessment/Plan:  1. Right lung pneumonia.  Patient finished Zithromax course and is on Rocephin. 2. Hemoptysis.  Patient in isolation and AFBs are sent off.  Awaiting results.  Apparently the lab did not get the third AFB.  The nurse will send off today. 3. History of COPD 4. History of occupational exposure 5. Tobacco abuse. 6. Depression on fluoxetine 7. Abnormal CT scan of the chest  Code Status:     Code Status Orders  (From admission, onward)        Start     Ordered   06/26/17 0817  Full code  Continuous     06/26/17 0817    Code Status History    Date Active Date Inactive Code Status Order ID Comments User Context   This patient has a current code status but no historical code status.      Disposition Plan: Can go home once AFBs are negative  Consultants:  Infectious disease  Antibiotics:  Rocephin  Time spent: 24 minutes.  Patient's mother at the bedside  [employer]

## 2017-06-30 LAB — QUANTIFERON-TB GOLD PLUS: QuantiFERON-TB Gold Plus: NEGATIVE

## 2017-06-30 LAB — QUANTIFERON-TB GOLD PLUS (RQFGPL)
QUANTIFERON NIL VALUE: 0.06 [IU]/mL
QuantiFERON TB1 Ag Value: 0.06 IU/mL
QuantiFERON TB2 Ag Value: 0.06 IU/mL

## 2017-06-30 LAB — CULTURE, RESPIRATORY W GRAM STAIN

## 2017-06-30 LAB — CULTURE, RESPIRATORY
CULTURE: NORMAL
SPECIAL REQUESTS: NORMAL

## 2017-06-30 NOTE — Progress Notes (Signed)
Patient ID: Brian Footsaron Sloan, male   DOB: 14-Dec-1980, 37 y.o.   MRN: [id number]  [facility] PROGRESS NOTE  Brian Sloan [id number] DOB: 14-Dec-1980 DOA: 06/25/2017 PCP: Patient, No Pcp Per  HPI/Subjective: Patient wants to go home.  Still waiting for 2 more AFBs to result.  Feels okay.  1 AFB negative.  Awaiting the  Objective: Vitals:   06/30/17 0810 06/30/17 1326  BP:  119/75  Pulse:  92  Resp:    Temp:  98.4 F (36.9 C)  SpO2: 98% 99%   No intake or output data in the 24 hours ending 06/30/17 1617 Filed Weights   06/25/17 2241  Weight: 59 kg (130 lb)    ROS: Review of Systems  Constitutional: Negative for chills and fever.  Eyes: Negative for blurred vision.  Respiratory: Positive for cough. Negative for shortness of breath.   Cardiovascular: Negative for chest pain.  Gastrointestinal: Negative for abdominal pain, constipation, diarrhea, nausea and vomiting.  Genitourinary: Negative for dysuria.  Musculoskeletal: Negative for joint pain.  Neurological: Negative for dizziness and headaches.   Exam: Physical Exam  Constitutional: He is oriented to person, place, and time.  HENT:  Nose: No mucosal edema.  Mouth/Throat: No oropharyngeal exudate or posterior oropharyngeal edema.  Eyes: Conjunctivae, EOM and lids are normal. Pupils are equal, round, and reactive to light.  Neck: No JVD present. Carotid bruit is not present. No edema present. No thyroid mass and no thyromegaly present.  Cardiovascular: S1 normal and S2 normal. Exam reveals no gallop.  No murmur heard. Pulses:      Dorsalis pedis pulses are 2+ on the right side, and 2+ on the left side.  Respiratory: No respiratory distress. He has decreased breath sounds in the right lower field and the left lower field. He has no wheezes. He has no rhonchi. He has no rales.  GI: Soft. Bowel sounds are normal. There is no tenderness.  Musculoskeletal:       Right ankle: He exhibits no swelling.       Left ankle: He  exhibits no swelling.  Lymphadenopathy:    He has no cervical adenopathy.  Neurological: He is alert and oriented to person, place, and time. No cranial nerve deficit.  Skin: Skin is warm. No rash noted. Nails show no clubbing.  Psychiatric: He has a normal mood and affect.      Data Reviewed: Basic Metabolic Panel: Recent Labs  Lab 06/25/17 2240 06/26/17 0829  NA 143 141  K 4.4 3.9  CL 108 105  CO2 26 27  GLUCOSE 91 140*  BUN 14 12  CREATININE 0.68 0.77  CALCIUM 9.2 8.9   Liver Function Tests: Recent Labs  Lab 06/25/17 2240  AST 16  ALT 14*  ALKPHOS 64  BILITOT 0.3  PROT 6.6  ALBUMIN 3.7   CBC: Recent Labs  Lab 06/25/17 2240 06/26/17 0829  WBC 6.7 8.7  NEUTROABS 4.3  --   HGB 14.2 14.2  HCT 42.7 43.1  MCV 89.8 90.8  PLT 263 244   Cardiac Enzymes: Recent Labs  Lab 06/25/17 2240  TROPONINI <0.03     Recent Results (from the past 240 hour(s))  Culture, blood (routine x 2)     Status: None (Preliminary result)   Collection Time: 06/26/17  1:56 AM  Result Value Ref Range Status   Specimen Description BLOOD LEFT AC  Final   Special Requests   Final    BOTTLES DRAWN AEROBIC AND ANAEROBIC Blood Culture results may not  be optimal due to an excessive volume of blood received in culture bottles   Culture   Final    NO GROWTH 4 DAYS Performed at [facility], [address] [place], [place] [address]    Report Status PENDING  Incomplete  Culture, blood (routine x 2)     Status: None (Preliminary result)   Collection Time: 06/26/17  1:56 AM  Result Value Ref Range Status   Specimen Description BLOOD LEFT FA  Final   Special Requests   Final    BOTTLES DRAWN AEROBIC AND ANAEROBIC Blood Culture adequate volume   Culture   Final    NO GROWTH 4 DAYS Performed at [facility], [address] [place], [place] [address]    Report Status PENDING  Incomplete  Respiratory Panel by PCR     Status: None   Collection Time: 06/27/17  4:25 PM   Result Value Ref Range Status   Adenovirus NOT DETECTED NOT DETECTED Final   Coronavirus 229E NOT DETECTED NOT DETECTED Final   Coronavirus HKU1 NOT DETECTED NOT DETECTED Final   Coronavirus NL63 NOT DETECTED NOT DETECTED Final   Coronavirus OC43 NOT DETECTED NOT DETECTED Final   Metapneumovirus NOT DETECTED NOT DETECTED Final   Rhinovirus / Enterovirus NOT DETECTED NOT DETECTED Final   Influenza A NOT DETECTED NOT DETECTED Final   Influenza B NOT DETECTED NOT DETECTED Final   Parainfluenza Virus 1 NOT DETECTED NOT DETECTED Final   Parainfluenza Virus 2 NOT DETECTED NOT DETECTED Final   Parainfluenza Virus 3 NOT DETECTED NOT DETECTED Final   Parainfluenza Virus 4 NOT DETECTED NOT DETECTED Final   Respiratory Syncytial Virus NOT DETECTED NOT DETECTED Final   Bordetella pertussis NOT DETECTED NOT DETECTED Final   Chlamydophila pneumoniae NOT DETECTED NOT DETECTED Final   Mycoplasma pneumoniae NOT DETECTED NOT DETECTED Final    Comment: Performed at [facility], [address], [place], [place] [address]  Culture, expectorated sputum-assessment     Status: None   Collection Time: 06/27/17  4:32 PM  Result Value Ref Range Status   Specimen Description EXPECTORATED SPUTUM  Final   Special Requests Normal  Final   Sputum evaluation   Final    THIS SPECIMEN IS ACCEPTABLE FOR SPUTUM CULTURE Performed at [facility], [address], [place], [place] [address]    Report Status 06/27/2017 FINAL  Final  Culture, respiratory (NON-Expectorated)     Status: None   Collection Time: 06/27/17  4:32 PM  Result Value Ref Range Status   Specimen Description   Final    EXPECTORATED SPUTUM Performed at [facility], [address] [place], [place] [address]    Special Requests   Final    Normal Reflexed from [id number] Performed at [facility], [address] [place], [place] [address]    Gram Stain   Final    FEW WBC PRESENT, PREDOMINANTLY PMN RARE SQUAMOUS  EPITHELIAL CELLS PRESENT FEW GRAM POSITIVE COCCI IN PAIRS RARE GRAM NEGATIVE RODS RARE BUDDING YEAST SEEN    Culture   Final    Consistent with normal respiratory flora. Performed at [facility], [address] [place], [place] [address]    Report Status 06/30/2017 FINAL  Final  Acid Fast Smear (AFB)     Status: None   Collection Time: 06/27/17  9:04 PM  Result Value Ref Range Status   AFB Specimen Processing Concentration  Final   Acid Fast Smear Negative  Final    Comment: (NOTE) Performed At: [facility] [address] [place], [place] [id number] Jolene Schimke MD [id number]    Source (AFB) EXPECTORATED SPUTUM  Final    Comment: Performed at [facility], [address] [place], [place] [address]      Scheduled Meds: . budesonide (PULMICORT) nebulizer solution  0.5 mg Nebulization BID  . FLUoxetine  20 mg Oral [id number]  . gabapentin  300 mg Oral BID  . ipratropium-albuterol  3 mL Nebulization TID  . mometasone-formoterol  2 puff Inhalation BID  . montelukast  10 mg Oral Daily  . nicotine  21 mg Transdermal Daily   Continuous Infusions: . cefTRIAXone (ROCEPHIN)  IV Stopped (06/29/17 1857)    Assessment/Plan:  1. Right lung pneumonia.  Patient finished Zithromax course and is on Rocephin. 2. Hemoptysis.  Patient in isolation and AFBs are sent off.   1 AFB is negative.  Awaiting the other two to result. 3. History of COPD 4. History of occupational exposure 5. Tobacco abuse. 6. Depression on fluoxetine 7. Abnormal CT scan of the chest  Code Status:     Code Status Orders  (From admission, onward)        Start     Ordered   06/26/17 0817  Full code  Continuous     06/26/17 0817    Code Status History    Date Active Date Inactive Code Status Order ID Comments User Context   This patient has a current code status but no historical code status.      Disposition Plan: Can go home once AFBs are  negative  Consultants:  Infectious disease  Antibiotics:  Rocephin  Time spent: 24 minutes.   Makynleigh Breslin [employer]

## 2017-07-01 LAB — CULTURE, BLOOD (ROUTINE X 2)
Culture: NO GROWTH
Culture: NO GROWTH
Special Requests: ADEQUATE

## 2017-07-01 MED ORDER — ALBUTEROL SULFATE HFA 108 (90 BASE) MCG/ACT IN AERS: 2.0000 | INHALATION_SPRAY | Freq: Four times a day (QID) | RESPIRATORY_TRACT | 0 refills | Status: DC | PRN

## 2017-07-01 MED ORDER — TIOTROPIUM BROMIDE MONOHYDRATE 18 MCG IN CAPS: 18.0000 ug | ORAL_CAPSULE | Freq: Every day | RESPIRATORY_TRACT | 0 refills | Status: AC

## 2017-07-01 MED ORDER — CEFTRIAXONE SODIUM 1 G IJ SOLR
1.0000 g | Freq: Once | INTRAMUSCULAR | Status: AC
  Administered 2017-07-01: 15:00:00 1 g via INTRAVENOUS
  Filled 2017-07-01: qty 10

## 2017-07-01 MED ORDER — NICOTINE 21 MG/24HR TD PT24: 21.0000 mg | MEDICATED_PATCH | Freq: Every day | TRANSDERMAL | 0 refills | Status: DC

## 2017-07-01 NOTE — Discharge Summary (Signed)
[facility] - [place] at [facility]   PATIENT NAME: Brian Sloan    MR#:  [id number]  DATE OF BIRTH:  [birth year]  DATE OF ADMISSION:  06/25/2017 ADMITTING PHYSICIAN: Ihor Austin, MD  DATE OF DISCHARGE: No discharge date for patient encounter.  PRIMARY CARE PHYSICIAN: Patient, No Pcp Per    ADMISSION DIAGNOSIS:  Lung infection [J18.9] Cough with hemoptysis [R04.2]  DISCHARGE DIAGNOSIS:  Active Problems:   Hemoptysis   SECONDARY DIAGNOSIS:   Past Medical History:  Diagnosis Date  . COPD (chronic obstructive pulmonary disease) (HCC)     HOSPITAL COURSE:   1.  Right lung pneumonia.  Patient finished an entire course of Rocephin and Zithromax here in the hospital. 2.  Hemoptysis.  This has stopped during the hospital course.  Likely secondary to pneumonia.  One AFB is negative at this point.  We are still waiting for the other to the results.  Case discussed with Dr. Sampson Goon and he said it is okay for this patient to be discharged.  He will follow-up in the next 2 AFBs and take appropriate management if these turn out positive.  Most likely they will turn out negative. 3.  COPD with abnormal CT scan of the chest.  Start on Spiriva and albuterol inhaler.  Patient has Advair inhaler already at home 4.  Tobacco abuse.  Smoking cessation counseling done during the hospital course.  Nicotine patch prescribed upon discharge home. 5.  Occupational exposure 6.  Depression on fluoxetine  DISCHARGE CONDITIONS:   Satisfactory  CONSULTS OBTAINED:  Treatment Team:  Yevonne Pax, MD Mick Sell, MD  DRUG ALLERGIES:  No Known Allergies  DISCHARGE MEDICATIONS:   Allergies as of 07/01/2017   No Known Allergies     Medication List    TAKE these medications   albuterol 108 (90 Base) MCG/ACT inhaler Commonly known as:  PROVENTIL HFA;VENTOLIN HFA Inhale 2 puffs into the lungs every 6 (six) hours as needed for wheezing or shortness of breath.    FLUoxetine 20 MG capsule Commonly known as:  PROZAC Take 1 capsule by mouth every morning.   Fluticasone-Salmeterol 250-50 MCG/DOSE Aepb Commonly known as:  ADVAIR Inhale 1 puff into the lungs 2 (two) times daily.   gabapentin 300 MG capsule Commonly known as:  NEURONTIN Take 300 mg by mouth 2 (two) times daily.   montelukast 10 MG tablet Commonly known as:  SINGULAIR Take 10 mg by mouth daily.   nicotine 21 mg/24hr patch Commonly known as:  NICODERM CQ - dosed in mg/24 hours Place 1 patch (21 mg total) onto the skin daily. Start taking on:  07/02/2017   tiotropium 18 MCG inhalation capsule Commonly known as:  SPIRIVA HANDIHALER Place 1 capsule (18 mcg total) into inhaler and inhale daily.        DISCHARGE INSTRUCTIONS:   Follow-up with Dr. Beverely Risen 2 week Follow-up with Dr. Freda Munro 1 week  If you experience worsening of your admission symptoms, develop shortness of breath, life threatening emergency, suicidal or homicidal thoughts you must seek medical attention immediately by calling 911 or calling your MD immediately  if symptoms less severe.  You Must read complete instructions/literature along with all the possible adverse reactions/side effects for all the Medicines you take and that have been prescribed to you. Take any new Medicines after you have completely understood and accept all the possible adverse reactions/side effects.   Please note  You were cared for by a hospitalist during your hospital  stay. If you have any questions about your discharge medications or the care you received while you were in the hospital after you are discharged, you can call the unit and asked to speak with the hospitalist on call if the hospitalist that took care of you is not available. Once you are discharged, your primary care physician will handle any further medical issues. Please note that NO REFILLS for any discharge medications will be authorized once you are discharged,  as it is imperative that you return to your primary care physician (or establish a relationship with a primary care physician if you do not have one) for your aftercare needs so that they can reassess your need for medications and monitor your lab values.    Today   CHIEF COMPLAINT:   Chief Complaint  Patient presents with  . Hemoptysis    HISTORY OF PRESENT ILLNESS:  Brian Sloan  is a 37 y.o. male presents with hemoptysis   VITAL SIGNS:  Blood pressure 112/69, pulse 87, temperature 97.9 F (36.6 C), temperature source Oral, resp. rate 18, height 5\' 8"  (1.727 m), weight 59 kg (130 lb), SpO2 100 %.  I/O:  No intake or output data in the 24 hours ending 07/01/17 1531  PHYSICAL EXAMINATION:  GENERAL:  37 y.o.-year-old patient lying in the bed with no acute distress.  EYES: Pupils equal, round, reactive to light and accommodation. No scleral icterus. Extraocular muscles intact.  HEENT: Head atraumatic, normocephalic. Oropharynx and nasopharynx clear.  NECK:  Supple, no jugular venous distention. No thyroid enlargement, no tenderness.  LUNGS: Normal breath sounds bilaterally, no wheezing, rales,rhonchi or crepitation. No use of accessory muscles of respiration.  CARDIOVASCULAR: S1, S2 normal. No murmurs, rubs, or gallops.  ABDOMEN: Soft, non-tender, non-distended. Bowel sounds present. No organomegaly or mass.  EXTREMITIES: No pedal edema, cyanosis, or clubbing.  NEUROLOGIC: Cranial nerves II through XII are intact. Muscle strength 5/5 in all extremities. Sensation intact. Gait not checked.  PSYCHIATRIC: The patient is alert and oriented x 3.  SKIN: No obvious rash, lesion, or ulcer.   DATA REVIEW:   CBC Recent Labs  Lab 06/26/17 0829  WBC 8.7  HGB 14.2  HCT 43.1  PLT 244    Chemistries  Recent Labs  Lab 06/25/17 2240 06/26/17 0829  NA 143 141  K 4.4 3.9  CL 108 105  CO2 26 27  GLUCOSE 91 140*  BUN 14 12  CREATININE 0.68 0.77  CALCIUM 9.2 8.9  AST 16  --    ALT 14*  --   ALKPHOS 64  --   BILITOT 0.3  --     Cardiac Enzymes Recent Labs  Lab 06/25/17 2240  TROPONINI <0.03    Microbiology Results  Results for orders placed or performed during the hospital encounter of 06/25/17  Culture, blood (routine x 2)     Status: None   Collection Time: 06/26/17  1:56 AM  Result Value Ref Range Status   Specimen Description BLOOD LEFT AC  Final   Special Requests   Final    BOTTLES DRAWN AEROBIC AND ANAEROBIC Blood Culture results may not be optimal due to an excessive volume of blood received in culture bottles   Culture   Final    NO GROWTH 5 DAYS Performed at [facility], [address], [place], [place] [address]    Report Status 07/01/2017 FINAL  Final  Culture, blood (routine x 2)     Status: None   Collection Time: 06/26/17  1:56 AM  Result Value Ref Range Status   Specimen Description BLOOD LEFT FA  Final   Special Requests   Final    BOTTLES DRAWN AEROBIC AND ANAEROBIC Blood Culture adequate volume   Culture   Final    NO GROWTH 5 DAYS Performed at [facility], [address] [place], [place] [address]    Report Status 07/01/2017 FINAL  Final  Respiratory Panel by PCR     Status: None   Collection Time: 06/27/17  4:25 PM  Result Value Ref Range Status   Adenovirus NOT DETECTED NOT DETECTED Final   Coronavirus 229E NOT DETECTED NOT DETECTED Final   Coronavirus HKU1 NOT DETECTED NOT DETECTED Final   Coronavirus NL63 NOT DETECTED NOT DETECTED Final   Coronavirus OC43 NOT DETECTED NOT DETECTED Final   Metapneumovirus NOT DETECTED NOT DETECTED Final   Rhinovirus / Enterovirus NOT DETECTED NOT DETECTED Final   Influenza A NOT DETECTED NOT DETECTED Final   Influenza B NOT DETECTED NOT DETECTED Final   Parainfluenza Virus 1 NOT DETECTED NOT DETECTED Final   Parainfluenza Virus 2 NOT DETECTED NOT DETECTED Final   Parainfluenza Virus 3 NOT DETECTED NOT DETECTED Final   Parainfluenza Virus 4 NOT DETECTED NOT  DETECTED Final   Respiratory Syncytial Virus NOT DETECTED NOT DETECTED Final   Bordetella pertussis NOT DETECTED NOT DETECTED Final   Chlamydophila pneumoniae NOT DETECTED NOT DETECTED Final   Mycoplasma pneumoniae NOT DETECTED NOT DETECTED Final    Comment: Performed at [facility], [address] [place], [place] [address]  Culture, expectorated sputum-assessment     Status: None   Collection Time: 06/27/17  4:32 PM  Result Value Ref Range Status   Specimen Description EXPECTORATED SPUTUM  Final   Special Requests Normal  Final   Sputum evaluation   Final    THIS SPECIMEN IS ACCEPTABLE FOR SPUTUM CULTURE Performed at [facility], [address] [place], [place] [address]    Report Status 06/27/2017 FINAL  Final  Culture, respiratory (NON-Expectorated)     Status: None   Collection Time: 06/27/17  4:32 PM  Result Value Ref Range Status   Specimen Description   Final    EXPECTORATED SPUTUM Performed at [facility], [address], [place], [place] [address]    Special Requests   Final    Normal Reflexed from [id number] Performed at [facility], [address] [place], [place] [address]    Gram Stain   Final    FEW WBC PRESENT, PREDOMINANTLY PMN RARE SQUAMOUS EPITHELIAL CELLS PRESENT FEW GRAM POSITIVE COCCI IN PAIRS RARE GRAM NEGATIVE RODS RARE BUDDING YEAST SEEN    Culture   Final    Consistent with normal respiratory flora. Performed at [facility], [address], [place], [place] [address]    Report Status 06/30/2017 FINAL  Final  Acid Fast Smear (AFB)     Status: None   Collection Time: 06/27/17  9:04 PM  Result Value Ref Range Status   AFB Specimen Processing Concentration  Final   Acid Fast Smear Negative  Final    Comment: (NOTE) Performed At: [facility] [address] [place], [place] [id number] Jolene Schimke MD [id number]    Source (AFB) EXPECTORATED SPUTUM  Final    Comment: Performed at [facility], [address] [place], [place] [address]       Management plans discussed with the patient, family and they are in agreement.  CODE STATUS:     Code Status Orders  (From admission, onward)        Start     Ordered   06/26/17 0817  Full code  Continuous     06/26/17 0817    Code Status History    Date Active Date Inactive Code Status Order ID Comments User Context   This patient has a current code status but no historical code status.      TOTAL TIME TAKING CARE OF THIS PATIENT: 32 minutes.    Alford Highlandichard Najmah Carradine M.D on 07/01/2017 at 3:31 PM  Between 7am to 6pm - Pager - [phone]  After 6pm go to [email or web address] - password [email or web address]  [facility] Office  [phone]  CC: Primary care physician; Patient, No Pcp Per

## 2017-07-01 NOTE — Progress Notes (Signed)
MD making rounds. Order received to discharge home. IV's removed. Prescriptions given to patient and family. Discharge paperwork provided, explained, signed and witnessed. No unanswered questions. Discharged via wheelchair with nursing staff. Belongings sent with patient and family. 

## 2017-07-01 NOTE — Discharge Instructions (Addendum)

## 2017-07-02 LAB — ACID FAST SMEAR (AFB, MYCOBACTERIA): Acid Fast Smear: NEGATIVE

## 2017-07-02 LAB — ACID FAST SMEAR (AFB): ACID FAST SMEAR - AFSCU2: NEGATIVE

## 2017-07-02 LAB — MTB NAA WITHOUT AFB CULTURE: M Tuberculosis, Naa: NEGATIVE

## 2017-07-15 ENCOUNTER — Ambulatory Visit: Payer: Medicaid Other | Admitting: Internal Medicine

## 2017-07-15 ENCOUNTER — Encounter: Payer: Self-pay | Admitting: Internal Medicine

## 2017-07-15 VITALS — BP 120/82 | HR 81 | Resp 16 | Ht 68.0 in | Wt 137.2 lb

## 2017-07-15 DIAGNOSIS — F17210 Nicotine dependence, cigarettes, uncomplicated: Secondary | ICD-10-CM | POA: Diagnosis not present

## 2017-07-15 DIAGNOSIS — J440 Chronic obstructive pulmonary disease with acute lower respiratory infection: Secondary | ICD-10-CM

## 2017-07-15 DIAGNOSIS — J209 Acute bronchitis, unspecified: Secondary | ICD-10-CM

## 2017-07-15 DIAGNOSIS — R042 Hemoptysis: Secondary | ICD-10-CM

## 2017-07-15 MED ORDER — ALBUTEROL SULFATE HFA 108 (90 BASE) MCG/ACT IN AERS: 2.0000 | INHALATION_SPRAY | Freq: Four times a day (QID) | RESPIRATORY_TRACT | 0 refills | Status: AC | PRN

## 2017-07-15 MED ORDER — FLUTICASONE-SALMETEROL 250-50 MCG/DOSE IN AEPB: 1.0000 | INHALATION_SPRAY | Freq: Two times a day (BID) | RESPIRATORY_TRACT | 3 refills | Status: AC

## 2017-07-15 NOTE — Progress Notes (Signed)
[facility] [address] [place], [place] [address]  Internal MEDICINE  Office Visit Note  Patient Name: Brian Footsaron Sloan  [id number]  [id number]  Date of Service: 07/15/2017   Complaints/HPI Pt is here for establishment of PCP.[facility] was [facility]. Pt is out of state and was visiting his mother. He did have multiple tests done during the hospital stay, will need  F/u Ct scan of the chest. AFB has been neagtive, some cx are pending. Continues to smoke, he was unable to get any inhalers due to financial problems  Cough  This is a new problem. The current episode started in the past 7 days. The problem has been gradually improving. The problem occurs hourly. The cough is productive of bloody sputum. Associated symptoms include chest pain, hemoptysis, postnasal drip, a sore throat and sweats. Pertinent negatives include no chills, eye redness, rash, rhinorrhea or shortness of breath.    Current Medication: Outpatient Encounter Medications as of 07/15/2017  Medication Sig  . albuterol (PROVENTIL HFA;VENTOLIN HFA) 108 (90 Base) MCG/ACT inhaler Inhale 2 puffs into the lungs every 6 (six) hours as needed for wheezing or shortness of breath.  Marland Kitchen. FLUoxetine (PROZAC) 20 MG capsule Take 1 capsule by mouth every morning.   . Fluticasone-Salmeterol (ADVAIR) 250-50 MCG/DOSE AEPB Inhale 1 puff into the lungs 2 (two) times daily.  Marland Kitchen. gabapentin (NEURONTIN) 300 MG capsule Take 300 mg by mouth 2 (two) times daily.  . montelukast (SINGULAIR) 10 MG tablet Take 10 mg by mouth daily.  . nicotine (NICODERM CQ - DOSED IN MG/24 HOURS) 21 mg/24hr patch Place 1 patch (21 mg total) onto the skin daily.  Marland Kitchen. tiotropium (SPIRIVA HANDIHALER) 18 MCG inhalation capsule Place 1 capsule (18 mcg total) into inhaler and inhale daily.  . [DISCONTINUED] albuterol (PROVENTIL HFA;VENTOLIN HFA) 108 (90 Base) MCG/ACT inhaler Inhale 2 puffs into the lungs every 6 (six) hours as needed for wheezing or shortness of  breath.  . [DISCONTINUED] Fluticasone-Salmeterol (ADVAIR) 250-50 MCG/DOSE AEPB Inhale 1 puff into the lungs 2 (two) times daily.   No facility-administered encounter medications on file as of 07/15/2017.     Surgical History: Past Surgical History:  Procedure Laterality Date  . HERNIA REPAIR     one on left, 2 surgeries on right    Medical History: Past Medical History:  Diagnosis Date  . COPD (chronic obstructive pulmonary disease) (HCC)     Family History: Family History  Problem Relation Age of Onset  . COPD Mother   . Alcoholism Mother     Social History   Socioeconomic History  . Marital status: Single    Spouse name: Not on file  . Number of children: Not on file  . Years of education: Not on file  . Highest education level: Not on file  Social Needs  . Financial resource strain: Not on file  . Food insecurity - worry: Not on file  . Food insecurity - inability: Not on file  . Transportation needs - medical: Not on file  . Transportation needs - non-medical: Not on file  Occupational History  . Occupation: unemployed  Tobacco Use  . Smoking status: Current Every Day Smoker  . Smokeless tobacco: Never Used  Substance and Sexual Activity  . Alcohol use: Yes    Comment: drinks liquor daily   . Drug use: Yes    Types: Methamphetamines  . Sexual activity: Not on file  Other Topics Concern  . Not on file  Social  History Narrative  . Not on file     Review of Systems  Constitutional: Negative for chills, fatigue and unexpected weight change.  HENT: Positive for postnasal drip and sore throat. Negative for congestion, rhinorrhea and sneezing.   Eyes: Negative for redness.  Respiratory: Positive for cough and hemoptysis. Negative for chest tightness and shortness of breath.   Cardiovascular: Positive for chest pain. Negative for palpitations.  Gastrointestinal: Negative for abdominal pain, constipation, diarrhea, nausea and vomiting.  Genitourinary:  Negative for dysuria and frequency.  Musculoskeletal: Negative for arthralgias, back pain, joint swelling and neck pain.  Skin: Negative for rash.  Neurological: Negative.  Negative for tremors and numbness.  Hematological: Negative for adenopathy. Does not bruise/bleed easily.  Psychiatric/Behavioral: Negative for behavioral problems (Depression), sleep disturbance and suicidal ideas. The patient is not nervous/anxious.     Vital Signs: BP 120/82 (BP Location: Left Arm, Patient Position: Sitting)   Pulse 81   Resp 16   Ht 5\' 8"  (1.727 m)   Wt 137 lb 3.2 oz (62.2 kg)   SpO2 98%   BMI 20.86 kg/m    Physical Exam  Constitutional: He is oriented to person, place, and time. He appears well-developed and well-nourished. No distress.  HENT:  Head: Normocephalic and atraumatic.  Mouth/Throat: Oropharynx is clear and moist. No oropharyngeal exudate.  Eyes: EOM are normal. Pupils are equal, round, and reactive to light.  Neck: Normal range of motion. Neck supple. No JVD present. No tracheal deviation present. No thyromegaly present.  Cardiovascular: Normal rate, regular rhythm and normal heart sounds. Exam reveals no gallop and no friction rub.  No murmur heard. Pulmonary/Chest: Effort normal. No respiratory distress. He has no wheezes. He has no rales. He exhibits no tenderness.  Abdominal: Soft. Bowel sounds are normal.  Musculoskeletal: Normal range of motion.  Lymphadenopathy:    He has no cervical adenopathy.  Neurological: He is alert and oriented to person, place, and time. No cranial nerve deficit.  Skin: Skin is warm and dry. He is not diaphoretic.  Psychiatric: He has a normal mood and affect. His behavior is normal. Judgment and thought content normal.    Assessment/Plan: 1. Hemoptysis - Continue to monitor and will see pulmonary  2. COPD with acute bronchitis (HCC) - Encouraged to use his inhalers. Might need to see alamap for drug assistance   3. Cigarette nicotine  dependence without complication -Eencourage smoking cessation   General Counseling: keeyon privitera understanding of the findings of todays visit and agrees with plan of treatment. I have discussed any further diagnostic evaluation that may be needed or ordered today. We also reviewed his medications today. he has been encouraged to call the office with any questions or concerns that should arise related to todays visit.     Meds ordered this encounter  Medications  . Fluticasone-Salmeterol (ADVAIR) 250-50 MCG/DOSE AEPB    Sig: Inhale 1 puff into the lungs 2 (two) times daily.    Dispense:  60 each    Refill:  3  . albuterol (PROVENTIL HFA;VENTOLIN HFA) 108 (90 Base) MCG/ACT inhaler    Sig: Inhale 2 puffs into the lungs every 6 (six) hours as needed for wheezing or shortness of breath.    Dispense:  1 Inhaler    Refill:  0    Time spent:25 Minutes

## 2017-07-20 ENCOUNTER — Other Ambulatory Visit: Payer: Self-pay

## 2017-07-20 ENCOUNTER — Inpatient Hospital Stay
Admission: EM | Admit: 2017-07-20 | Discharge: 2017-07-22 | DRG: 918 | Disposition: A | Discharge status: Home / Self Care | Payer: Self-pay | Attending: Internal Medicine | Admitting: Internal Medicine

## 2017-07-20 ENCOUNTER — Encounter: Payer: Self-pay | Admitting: Emergency Medicine

## 2017-07-20 ENCOUNTER — Emergency Department: Payer: Self-pay

## 2017-07-20 DIAGNOSIS — Z811 Family history of alcohol abuse and dependence: Secondary | ICD-10-CM

## 2017-07-20 DIAGNOSIS — Z7951 Long term (current) use of inhaled steroids: Secondary | ICD-10-CM

## 2017-07-20 DIAGNOSIS — T43621A Poisoning by amphetamines, accidental (unintentional), initial encounter: Principal | ICD-10-CM | POA: Diagnosis present

## 2017-07-20 DIAGNOSIS — Z825 Family history of asthma and other chronic lower respiratory diseases: Secondary | ICD-10-CM

## 2017-07-20 DIAGNOSIS — F1010 Alcohol abuse, uncomplicated: Secondary | ICD-10-CM | POA: Diagnosis present

## 2017-07-20 DIAGNOSIS — R042 Hemoptysis: Secondary | ICD-10-CM | POA: Diagnosis present

## 2017-07-20 DIAGNOSIS — F1590 Other stimulant use, unspecified, uncomplicated: Secondary | ICD-10-CM | POA: Diagnosis present

## 2017-07-20 DIAGNOSIS — J439 Emphysema, unspecified: Secondary | ICD-10-CM | POA: Diagnosis present

## 2017-07-20 DIAGNOSIS — F17210 Nicotine dependence, cigarettes, uncomplicated: Secondary | ICD-10-CM | POA: Diagnosis present

## 2017-07-20 DIAGNOSIS — Z23 Encounter for immunization: Secondary | ICD-10-CM

## 2017-07-20 DIAGNOSIS — J8489 Other specified interstitial pulmonary diseases: Secondary | ICD-10-CM | POA: Diagnosis present

## 2017-07-20 HISTORY — DX: Hemoptysis: R04.2

## 2017-07-20 LAB — COMPREHENSIVE METABOLIC PANEL
ALT: 15 U/L — ABNORMAL LOW (ref 17–63)
ANION GAP: 10 (ref 5–15)
AST: 18 U/L (ref 15–41)
Albumin: 3.8 g/dL (ref 3.5–5.0)
Alkaline Phosphatase: 55 U/L (ref 38–126)
BUN: 13 mg/dL (ref 6–20)
CHLORIDE: 104 mmol/L (ref 101–111)
CO2: 27 mmol/L (ref 22–32)
Calcium: 8.6 mg/dL — ABNORMAL LOW (ref 8.9–10.3)
Creatinine, Ser: 0.88 mg/dL (ref 0.61–1.24)
Glucose, Bld: 101 mg/dL — ABNORMAL HIGH (ref 65–99)
POTASSIUM: 4.1 mmol/L (ref 3.5–5.1)
SODIUM: 141 mmol/L (ref 135–145)
Total Bilirubin: 0.6 mg/dL (ref 0.3–1.2)
Total Protein: 6.6 g/dL (ref 6.5–8.1)

## 2017-07-20 LAB — CBC
HEMATOCRIT: 44.1 % (ref 40.0–52.0)
HEMOGLOBIN: 14.6 g/dL (ref 13.0–18.0)
MCH: 29.2 pg (ref 26.0–34.0)
MCHC: 33.1 g/dL (ref 32.0–36.0)
MCV: 88.4 fL (ref 80.0–100.0)
Platelets: 242 10*3/uL (ref 150–440)
RBC: 4.99 MIL/uL (ref 4.40–5.90)
RDW: 16.2 % — ABNORMAL HIGH (ref 11.5–14.5)
WBC: 6.3 10*3/uL (ref 3.8–10.6)

## 2017-07-20 LAB — TYPE AND SCREEN
ABO/RH(D): B POS
Antibody Screen: NEGATIVE

## 2017-07-20 LAB — PROTIME-INR
INR: 0.95
PROTHROMBIN TIME: 12.6 s (ref 11.4–15.2)

## 2017-07-20 LAB — APTT: APTT: 28 s (ref 24–36)

## 2017-07-20 MED ORDER — SODIUM CHLORIDE 0.9 % IV SOLN
INTRAVENOUS | Status: DC
  Administered 2017-07-20 – 2017-07-22 (×5): via INTRAVENOUS

## 2017-07-20 MED ORDER — ACETAMINOPHEN 325 MG PO TABS: 650.0000 mg | ORAL_TABLET | Freq: Four times a day (QID) | ORAL | Status: DC | PRN

## 2017-07-20 MED ORDER — IOPAMIDOL (ISOVUE-300) INJECTION 61%
75.0000 mL | Freq: Once | INTRAVENOUS | Status: AC | PRN
  Administered 2017-07-20: 75 mL via INTRAVENOUS

## 2017-07-20 MED ORDER — ONDANSETRON HCL 4 MG PO TABS: 4.0000 mg | ORAL_TABLET | Freq: Four times a day (QID) | ORAL | Status: DC | PRN

## 2017-07-20 MED ORDER — MOMETASONE FURO-FORMOTEROL FUM 200-5 MCG/ACT IN AERO
2.0000 | INHALATION_SPRAY | Freq: Two times a day (BID) | RESPIRATORY_TRACT | Status: DC
  Administered 2017-07-20 – 2017-07-22 (×5): 2 via RESPIRATORY_TRACT
  Filled 2017-07-20: qty 8.8

## 2017-07-20 MED ORDER — PNEUMOCOCCAL VAC POLYVALENT 25 MCG/0.5ML IJ INJ
0.5000 mL | INJECTION | INTRAMUSCULAR | Status: AC
  Administered 2017-07-21: 17:00:00 0.5 mL via INTRAMUSCULAR
  Filled 2017-07-20: qty 0.5

## 2017-07-20 MED ORDER — ACETAMINOPHEN 650 MG RE SUPP
650.0000 mg | Freq: Four times a day (QID) | RECTAL | Status: DC | PRN
Start: 2017-07-20 — End: 2017-07-22

## 2017-07-20 MED ORDER — ONDANSETRON HCL 4 MG/2ML IJ SOLN: 4.0000 mg | Freq: Four times a day (QID) | INTRAMUSCULAR | Status: DC | PRN

## 2017-07-20 MED ORDER — ALBUTEROL SULFATE (2.5 MG/3ML) 0.083% IN NEBU: 3.0000 mL | INHALATION_SOLUTION | Freq: Four times a day (QID) | RESPIRATORY_TRACT | Status: DC | PRN

## 2017-07-20 MED ORDER — TIOTROPIUM BROMIDE MONOHYDRATE 18 MCG IN CAPS
18.0000 ug | ORAL_CAPSULE | Freq: Every day | RESPIRATORY_TRACT | Status: DC
  Administered 2017-07-20 – 2017-07-22 (×3): 18 ug via RESPIRATORY_TRACT
  Filled 2017-07-20: qty 5

## 2017-07-20 NOTE — H&P (Addendum)
[facility] [place] [facility]   PATIENT NAME: Brian Sloan    MR#:  [id number]  DATE OF BIRTH:  02/19/1981  DATE OF ADMISSION:  07/20/2017  PRIMARY CARE PHYSICIAN: Patient, No Pcp Per   REQUESTING/REFERRING PHYSICIAN:   CHIEF COMPLAINT:   Chief Complaint  Patient presents with  . Hemoptysis    HISTORY OF PRESENT ILLNESS: Brian Sloan  is a 37 y.o. male with a known history of COPD, hemoptysis in the past presented to the emergency room for coughing up blood late last night and this morning.  Patient had coughed up blood.  No vomiting of blood or any rectal bleed.  He was evaluated in February for hemoptysis and was seen by pulmonary attending Dr. Welton Flakes .Hemoglobin is stable after presentation to the emergency room.  No new episodes of coughing up blood in the emergency room.  Case was discussed with pulmonary attending by ER physician who recommended observation admission.  No complaints of any chest pain, shortness of breath.  No difficulty swallowing food.  No fever and chills.  Was worked up in the emergency room with  and CT scan of the chest showed chronic volume loss in the right lung along with mediastinal adenopathy.  PAST MEDICAL HISTORY:   Past Medical History:  Diagnosis Date  . COPD (chronic obstructive pulmonary disease) (HCC)   . Hemoptysis     PAST SURGICAL HISTORY:  Past Surgical History:  Procedure Laterality Date  . HERNIA REPAIR     one on left, 2 surgeries on right    SOCIAL HISTORY:  Social History   Tobacco Use  . Smoking status: Current Every Day Smoker    Packs/day: 0.50    Types: Cigarettes  . Smokeless tobacco: Never Used  Substance Use Topics  . Alcohol use: Yes    Comment: drinks liquor daily-"no more than a pint"    FAMILY HISTORY:  Family History  Problem Relation Age of Onset  . COPD Mother   . Alcoholism Mother     DRUG ALLERGIES: No Known Allergies  REVIEW OF SYSTEMS:   CONSTITUTIONAL: No fever,  fatigue or weakness.  EYES: No blurred or double vision.  EARS, NOSE, AND THROAT: No tinnitus or ear pain.  RESPIRATORY: Has cough,  No shortness of breath, wheezing  Had hemoptysis.  CARDIOVASCULAR: No chest pain, orthopnea, edema.  GASTROINTESTINAL: No nausea, vomiting, diarrhea or abdominal pain.  GENITOURINARY: No dysuria, hematuria.  ENDOCRINE: No polyuria, nocturia,  HEMATOLOGY: No anemia, easy bruising  SKIN: No rash or lesion. MUSCULOSKELETAL: No joint pain or arthritis.   NEUROLOGIC: No tingling, numbness, weakness.  PSYCHIATRY: No anxiety or depression.   MEDICATIONS AT HOME:  Prior to Admission medications   Medication Sig Start Date End Date Taking? Authorizing Provider  albuterol (PROVENTIL HFA;VENTOLIN HFA) 108 (90 Base) MCG/ACT inhaler Inhale 2 puffs into the lungs every 6 (six) hours as needed for wheezing or shortness of breath. 07/15/17  Yes Lyndon Code, MD  FLUoxetine (PROZAC) 20 MG capsule Take 1 capsule by mouth every morning.    Yes [provider]  Fluticasone-Salmeterol (ADVAIR) 250-50 MCG/DOSE AEPB Inhale 1 puff into the lungs 2 (two) times daily. 07/15/17  Yes Lyndon Code, MD  gabapentin (NEURONTIN) 300 MG capsule Take 300 mg by mouth 2 (two) times daily.   Yes [provider]  montelukast (SINGULAIR) 10 MG tablet Take 10 mg by mouth daily.   Yes [provider]  tiotropium (SPIRIVA HANDIHALER) 18 MCG  inhalation capsule Place 1 capsule (18 mcg total) into inhaler and inhale daily. 07/01/17 07/01/18 Yes Wieting, Richard, MD  nicotine (NICODERM CQ - DOSED IN MG/24 HOURS) 21 mg/24hr patch Place 1 patch (21 mg total) onto the skin daily. Patient not taking: Reported on 07/20/2017 07/02/17   Alford HighlandWieting, Richard, MD      PHYSICAL EXAMINATION:   VITAL SIGNS: Blood pressure 123/75, pulse 72, temperature 97.9 F (36.6 C), temperature source Oral, resp. rate (!) 21, height 5\' 8"  (1.727 m), weight 61.2 kg (135 lb), SpO2 95 %.  GENERAL:  37  y.o.-year-old patient lying in the bed with no acute distress.  EYES: Pupils equal, round, reactive to light and accommodation. No scleral icterus. Extraocular muscles intact.  HEENT: Head atraumatic, normocephalic. Oropharynx dry and nasopharynx clear.  NECK:  Supple, no jugular venous distention. No thyroid enlargement, no tenderness.  LUNGS: Normal breath sounds bilaterally, no wheezing, rales,rhonchi or crepitation. No use of accessory muscles of respiration.  CARDIOVASCULAR: S1, S2 normal. No murmurs, rubs, or gallops.  ABDOMEN: Soft, nontender, nondistended. Bowel sounds present. No organomegaly or mass.  EXTREMITIES: No pedal edema, cyanosis, or clubbing.  NEUROLOGIC: Cranial nerves II through XII are intact. Muscle strength 5/5 in all extremities. Sensation intact. Gait not checked.  PSYCHIATRIC: The patient is alert and oriented x 3.  SKIN: No obvious ulcers or skin break down.  LABORATORY PANEL:   CBC Recent Labs  Lab 07/20/17 0102  WBC 6.3  HGB 14.6  HCT 44.1  PLT 242  MCV 88.4  MCH 29.2  MCHC 33.1  RDW 16.2*   ------------------------------------------------------------------------------------------------------------------  Chemistries  Recent Labs  Lab 07/20/17 0102  NA 141  K 4.1  CL 104  CO2 27  GLUCOSE 101*  BUN 13  CREATININE 0.88  CALCIUM 8.6*  AST 18  ALT 15*  ALKPHOS 55  BILITOT 0.6   ------------------------------------------------------------------------------------------------------------------ estimated creatinine clearance is 102.4 mL/min (by C-G formula based on SCr of 0.88 mg/dL). ------------------------------------------------------------------------------------------------------------------ No results for input(s): TSH, T4TOTAL, T3FREE, THYROIDAB in the last 72 hours.  Invalid input(s): FREET3   Coagulation profile Recent Labs  Lab 07/20/17 0801  INR 0.95    ------------------------------------------------------------------------------------------------------------------- No results for input(s): DDIMER in the last 72 hours. -------------------------------------------------------------------------------------------------------------------  Cardiac Enzymes No results for input(s): CKMB, TROPONINI, MYOGLOBIN in the last 168 hours.  Invalid input(s): CK ------------------------------------------------------------------------------------------------------------------ Invalid input(s): POCBNP  ---------------------------------------------------------------------------------------------------------------  Urinalysis No results found for: COLORURINE, APPEARANCEUR, LABSPEC, PHURINE, GLUCOSEU, HGBUR, BILIRUBINUR, KETONESUR, PROTEINUR, UROBILINOGEN, NITRITE, LEUKOCYTESUR   RADIOLOGY: Ct Chest W Contrast  Result Date: 07/20/2017 CLINICAL DATA:  Recurrent hemoptysis. EXAM: CT CHEST WITH CONTRAST TECHNIQUE: Multidetector CT imaging of the chest was performed during intravenous contrast administration. CONTRAST:  75mL ISOVUE-300 IOPAMIDOL (ISOVUE-300) INJECTION 61% COMPARISON:  CTA chest dated June 26, 2017. FINDINGS: Cardiovascular: Normal heart size. No pericardial effusion. Normal caliber thoracic aorta. The right pulmonary arterial and venous system remains attenuated, similar to prior study. Tortuous, hypertrophy bronchial arteries are again noted arising from the aorta. Mediastinum/Nodes: Slight interval decrease in size of the right upper paratracheal lymph node, now measuring 13 mm in short axis, previously 19 mm. Borderline enlarged prevascular and subcarinal lymph nodes are unchanged. The mediastinum remains shifted to the right. The thyroid gland and esophagus are unremarkable. Lungs/Pleura: Unchanged volume loss in the right lung with hyperinflation of the left lung. Persistent asymmetric inter- and intralobular septal reticulonodular  thickening, also involving the major and minor fissures, with scattered subpleural alveolar opacities and prominent peribronchial thickening throughout the right  lung. This is similar appearance to prior study. Trace right pleural effusion. Small areas of mucous impaction in the left apex are unchanged. Small pulmonary nodules in the left upper lobe measuring 5-6 mm are unchanged and may represent mucous impaction. Upper Abdomen: No acute abnormality. Musculoskeletal: No chest wall abnormality. No acute or significant osseous findings. IMPRESSION: 1. Unchanged chronic volume loss in the right lung with reticulonodular intra- and interlobular septal thickening in a perilymphatic distribution. Given the unilateral involvement of the right lung with attenuation of the right pulmonary arterial and venous system, the patient may have suffered an early developmental insult and developed secondary chronic organizing pneumonia or atypical infection. Given the additional mediastinal adenopathy, lymphangitic carcinomatosis and sarcoidosis also remain in the differential. Pulmonary consultation, short-term follow-up chest CT in 3 months, and comparison with any prior outside imaging is recommended. 2. Tortuous, hypertrophied bronchial arteries again noted arising from the aorta. Electronically Signed   By: Obie Dredge M.D.   On: 07/20/2017 09:50    EKG: Orders placed or performed during the hospital encounter of 07/20/17  . ED EKG  . ED EKG  . EKG 12-Lead  . EKG 12-Lead  . EKG 12-Lead  . EKG 12-Lead    IMPRESSION AND PLAN: 37 year old male patient with history of COPD, hemoptysis in the past presented to the emergency room with coughing of blood.  1.  Hemoptysis  admit patient to medical floor observation bed IV fluid hydration Clear liquid diet if patient is able to tolerate   pulmonary consultation for possible bronchoscopy in a.m. Monitor hemoglobin hematocrit  2.  Emphysema Stable  3.  Tobacco  abuse  tobacco cessation counseled to the patient  4.  DVT prophylaxis Avoid blood thinner medication secondary to hemoptysis Sequential compression device to lower extremities for DVT prophylaxis    All the records are reviewed and case discussed with ED provider. Management plans discussed with the patient, family and they are in agreement.  CODE STATUS: Full code Code Status History    Date Active Date Inactive Code Status Order ID Comments User Context   06/26/2017 08:17 07/01/2017 20:13 Full Code [id number]  Ihor Austin, MD Inpatient       TOTAL TIME TAKING CARE OF THIS PATIENT: 50 minutes.    Ihor Austin M.D on 07/20/2017 at 10:43 AM  Between 7am to 6pm - Pager - [phone]  After 6pm go to [email or web address] - password [email or web address]  [facility]  Office  [phone]  CC: Primary care physician; Patient, No Pcp Per

## 2017-07-20 NOTE — ED Triage Notes (Addendum)
Pt says around 7am today he started having cough with small amount of blood noted; has had intermittent episodes of same throughout the day; just prior to arrival pt says he coughed up a large amount of blood; pt was admitted here mid Feb for same and stayed almost a week; first TB was negative; pt ambulatory with steady gait; talking in complete coherent sentences; discharge diagnosis was "hemoptysis"; pt admits to drinking "no more than a pint" of liquor a day

## 2017-07-20 NOTE — ED Notes (Signed)
Pt up to desk asking about wait time; informed of wait delay; pt ambulatory with steady gait;

## 2017-07-20 NOTE — ED Provider Notes (Signed)
[facility] Emergency Department Provider Note   ____________________________________________   First MD Initiated Contact with Patient 07/20/17 [phone]     (approximate)  I have reviewed the triage vital signs and the nursing notes.   HISTORY  Chief Complaint Hemoptysis    HPI Brian Sloan is a 37 y.o. male Who was admitted on February 14 for massive hemoptysis. Patient reports hemoptysis started again last night. He showed me a picture with large clumps of blood splattered all over his carpet next cigarette butt. The clumps of blood were much larger cigarette butt. Patient still smokes.  he does say that he's cut down to 2 cigarettes a day. Patient had a negative CT scan of the previous admission AFB were negative at the previous admission. Patient is not lightheaded is not running a fever is not coughing at present. I will re-CT him in case anything has changed and I am trying to contact pulmonary medicine at present with just paged pulmonary doctor. patient reports steady aching right-sided chest pain which is similar to what he had last time Patient is not short of breath.    Past Medical History:  Diagnosis Date  . COPD (chronic obstructive pulmonary disease) (HCC)   . Hemoptysis     Patient Active Problem List   Diagnosis Date Noted  . Hemoptysis 06/26/2017    Past Surgical History:  Procedure Laterality Date  . HERNIA REPAIR     one on left, 2 surgeries on right    Prior to Admission medications   Medication Sig Start Date End Date Taking? Authorizing Provider  albuterol (PROVENTIL HFA;VENTOLIN HFA) 108 (90 Base) MCG/ACT inhaler Inhale 2 puffs into the lungs every 6 (six) hours as needed for wheezing or shortness of breath. 07/15/17   Lyndon CodeKhan, Fozia M, MD  FLUoxetine (PROZAC) 20 MG capsule Take 1 capsule by mouth every morning.     [provider]  Fluticasone-Salmeterol (ADVAIR) 250-50 MCG/DOSE AEPB Inhale 1 puff into the lungs 2 (two)  times daily. 07/15/17   Lyndon CodeKhan, Fozia M, MD  gabapentin (NEURONTIN) 300 MG capsule Take 300 mg by mouth 2 (two) times daily.    [provider]  montelukast (SINGULAIR) 10 MG tablet Take 10 mg by mouth daily.    [provider]  nicotine (NICODERM CQ - DOSED IN MG/24 HOURS) 21 mg/24hr patch Place 1 patch (21 mg total) onto the skin daily. 07/02/17   Alford HighlandWieting, Richard, MD  tiotropium (SPIRIVA HANDIHALER) 18 MCG inhalation capsule Place 1 capsule (18 mcg total) into inhaler and inhale daily. 07/01/17 07/01/18  Alford HighlandWieting, Richard, MD    Allergies Patient has no known allergies.  Family History  Problem Relation Age of Onset  . COPD Mother   . Alcoholism Mother     Social History Social History   Tobacco Use  . Smoking status: Current Every Day Smoker    Packs/day: 0.50    Types: Cigarettes  . Smokeless tobacco: Never Used  Substance Use Topics  . Alcohol use: Yes    Comment: drinks liquor daily-"no more than a pint"  . Drug use: Yes    Types: Methamphetamines    Comment: no drugs used since October 2018    Review of Systems  Constitutional: No fever/chills Eyes: No visual changes. ENT: No sore throat. Cardiovascular:  chest pain. Respiratory: Denies shortness of breath. Gastrointestinal: No abdominal pain.  No nausea, no vomiting.  No diarrhea.  No constipation. Genitourinary: Negative for dysuria. Musculoskeletal: Negative for back pain. Skin:  Negative for rash. Neurological: Negative for headaches, focal weakness  ____________________________________________   PHYSICAL EXAM:  VITAL SIGNS: ED Triage Vitals  Enc Vitals Group     BP 07/20/17 0043 111/76     Pulse Rate 07/20/17 0043 95     Resp 07/20/17 0043 16     Temp 07/20/17 0043 98 F (36.7 C)     Temp Source 07/20/17 0043 Oral     SpO2 07/20/17 0043 97 %     Weight 07/20/17 0043 135 lb (61.2 kg)     Height 07/20/17 0043 5\' 8"  (1.727 m)     Head Circumference --      Peak Flow --      Pain Score  07/20/17 0052 7     Pain Loc --      Pain Edu? --      Excl. in GC? --     Constitutional: Alert and oriented. Well appearing and in no acute distress. Eyes: Conjunctivae are normal.  Head: Atraumatic. Nose: No congestion/rhinnorhea. Mouth/Throat: Mucous membranes are moist.  Oropharynx non-erythematous. Neck: No stridor.  Cardiovascular: Normal rate, regular rhythm. Grossly normal heart sounds.  Good peripheral circulation. Respiratory: Normal respiratory effort.  No retractions. Lungsscattered wheezes. Gastrointestinal: Soft and nontender. No distention. No abdominal bruits. No CVA tenderness. Musculoskeletal: No lower extremity tenderness nor edema.  No joint effusions. Neurologic:  Normal speech and language. No gross focal neurologic deficits are appreciated. No gait instability. Skin:  Skin is warm, dry and intact. No rash noted. Psychiatric: Mood and affect are normal. Speech and behavior are normal.  ____________________________________________   LABS (all labs ordered are listed, but only abnormal results are displayed)  Labs Reviewed  COMPREHENSIVE METABOLIC PANEL - Abnormal; Notable for the following components:      Result Value   Glucose, Bld 101 (*)    Calcium 8.6 (*)    ALT 15 (*)    All other components within normal limits  CBC - Abnormal; Notable for the following components:   RDW 16.2 (*)    All other components within normal limits  PROTIME-INR  APTT  TYPE AND SCREEN   ____________________________________________  EKG EKG read and interpreted by me shows normal sinus rhythm rate of 80 normal axis no acute ST-T wave changes  ____________________________________________  RADIOLOGY  ED MD interpretation:   Official radiology report(s): Ct Chest W Contrast  Result Date: 07/20/2017 CLINICAL DATA:  Recurrent hemoptysis. EXAM: CT CHEST WITH CONTRAST TECHNIQUE: Multidetector CT imaging of the chest was performed during intravenous contrast  administration. CONTRAST:  75mL ISOVUE-300 IOPAMIDOL (ISOVUE-300) INJECTION 61% COMPARISON:  CTA chest dated June 26, 2017. FINDINGS: Cardiovascular: Normal heart size. No pericardial effusion. Normal caliber thoracic aorta. The right pulmonary arterial and venous system remains attenuated, similar to prior study. Tortuous, hypertrophy bronchial arteries are again noted arising from the aorta. Mediastinum/Nodes: Slight interval decrease in size of the right upper paratracheal lymph node, now measuring 13 mm in short axis, previously 19 mm. Borderline enlarged prevascular and subcarinal lymph nodes are unchanged. The mediastinum remains shifted to the right. The thyroid gland and esophagus are unremarkable. Lungs/Pleura: Unchanged volume loss in the right lung with hyperinflation of the left lung. Persistent asymmetric inter- and intralobular septal reticulonodular thickening, also involving the major and minor fissures, with scattered subpleural alveolar opacities and prominent peribronchial thickening throughout the right lung. This is similar appearance to prior study. Trace right pleural effusion. Small areas of mucous impaction in the left apex are unchanged. Small pulmonary  nodules in the left upper lobe measuring 5-6 mm are unchanged and may represent mucous impaction. Upper Abdomen: No acute abnormality. Musculoskeletal: No chest wall abnormality. No acute or significant osseous findings. IMPRESSION: 1. Unchanged chronic volume loss in the right lung with reticulonodular intra- and interlobular septal thickening in a perilymphatic distribution. Given the unilateral involvement of the right lung with attenuation of the right pulmonary arterial and venous system, the patient may have suffered an early developmental insult and developed secondary chronic organizing pneumonia or atypical infection. Given the additional mediastinal adenopathy, lymphangitic carcinomatosis and sarcoidosis also remain in the  differential. Pulmonary consultation, short-term follow-up chest CT in 3 months, and comparison with any prior outside imaging is recommended. 2. Tortuous, hypertrophied bronchial arteries again noted arising from the aorta. Electronically Signed   By: Obie Dredge M.D.   On: 07/20/2017 09:50    ____________________________________________   PROCEDURES  Procedure(s) performed:   Procedures  Critical Care performed:   ____________________________________________   INITIAL IMPRESSION / ASSESSMENT AND PLAN / ED COURSE  discussed with Dr. Nickolas Madrid, pulmonary medicine we will get CT scan probably admit him for observation for the time being         ____________________________________________   FINAL CLINICAL IMPRESSION(S) / ED DIAGNOSES  Final diagnoses:  Hemoptysis     ED Discharge Orders    None       Note:  This document was prepared using Dragon voice recognition software and may include unintentional dictation errors.    Arnaldo Natal, MD 07/20/17 [phone]

## 2017-07-20 NOTE — ED Notes (Signed)
Pt transported to CT ?

## 2017-07-20 NOTE — ED Notes (Signed)
Admitting MD at bedside at this time.

## 2017-07-21 DIAGNOSIS — R042 Hemoptysis: Secondary | ICD-10-CM

## 2017-07-21 DIAGNOSIS — J849 Interstitial pulmonary disease, unspecified: Secondary | ICD-10-CM

## 2017-07-21 LAB — BASIC METABOLIC PANEL
Anion gap: 8 (ref 5–15)
BUN: 10 mg/dL (ref 6–20)
CHLORIDE: 106 mmol/L (ref 101–111)
CO2: 24 mmol/L (ref 22–32)
Calcium: 8 mg/dL — ABNORMAL LOW (ref 8.9–10.3)
Creatinine, Ser: 0.75 mg/dL (ref 0.61–1.24)
GFR calc Af Amer: >60 mL/min (ref >60)
GLUCOSE: 105 mg/dL — AB (ref 65–99)
POTASSIUM: 3.6 mmol/L (ref 3.5–5.1)
Sodium: 138 mmol/L (ref 135–145)

## 2017-07-21 LAB — CBC
HCT: 42.1 % (ref 40.0–52.0)
HEMOGLOBIN: 13.9 g/dL (ref 13.0–18.0)
MCH: 29.1 pg (ref 26.0–34.0)
MCHC: 32.9 g/dL (ref 32.0–36.0)
MCV: 88.3 fL (ref 80.0–100.0)
PLATELETS: 219 10*3/uL (ref 150–440)
RBC: 4.77 MIL/uL (ref 4.40–5.90)
RDW: 15.7 % — ABNORMAL HIGH (ref 11.5–14.5)
WBC: 5.9 10*3/uL (ref 3.8–10.6)

## 2017-07-21 MED ORDER — PHENYLEPHRINE HCL 0.25 % NA SOLN
1.0000 | Freq: Four times a day (QID) | NASAL | Status: DC | PRN
  Filled 2017-07-21: qty 15

## 2017-07-21 MED ORDER — BUTAMBEN-TETRACAINE-BENZOCAINE 2-2-14 % EX AERO
1.0000 | INHALATION_SPRAY | Freq: Once | CUTANEOUS | Status: DC
  Filled 2017-07-21: qty 20

## 2017-07-21 MED ORDER — LIDOCAINE HCL 2 % EX GEL
1.0000 "application " | Freq: Once | CUTANEOUS | Status: DC
  Filled 2017-07-21: qty 5

## 2017-07-21 NOTE — Plan of Care (Signed)
  Progressing Education: Knowledge of General Education information will improve 07/21/2017 0319 - Progressing by Peterson LombardKlenner, Mohamad Bruso C, RN Health Behavior/Discharge Planning: Ability to manage health-related needs will improve 07/21/2017 0319 - Progressing by Peterson LombardKlenner, Mariajose Mow C, RN Clinical Measurements: Ability to maintain clinical measurements within normal limits will improve 07/21/2017 0319 - Progressing by Peterson LombardKlenner, Zailyn Thoennes C, RN Will remain free from infection 07/21/2017 0319 - Progressing by Peterson LombardKlenner, Charlie Char C, RN Diagnostic test results will improve 07/21/2017 0319 - Progressing by Peterson LombardKlenner, Bela Bonaparte C, RN Respiratory complications will improve 07/21/2017 0319 - Progressing by Peterson LombardKlenner, Rielly Corlett C, RN Cardiovascular complication will be avoided 07/21/2017 0319 - Progressing by Peterson LombardKlenner, Jejuan Scala C, RN Activity: Risk for activity intolerance will decrease 07/21/2017 0319 - Progressing by Peterson LombardKlenner, Grey Rakestraw C, RN Nutrition: Adequate nutrition will be maintained 07/21/2017 0319 - Progressing by Peterson LombardKlenner, Arshia Spellman C, RN Coping: Level of anxiety will decrease 07/21/2017 0319 - Progressing by Peterson LombardKlenner, Gennie Dib C, RN Elimination: Will not experience complications related to bowel motility 07/21/2017 0319 - Progressing by Peterson LombardKlenner, Gilford Lardizabal C, RN Will not experience complications related to urinary retention 07/21/2017 0319 - Progressing by Peterson LombardKlenner, Violanda Bobeck C, RN Pain Managment: General experience of comfort will improve 07/21/2017 0319 - Progressing by Peterson LombardKlenner, Lovette Merta C, RN Safety: Ability to remain free from injury will improve 07/21/2017 0319 - Progressing by Peterson LombardKlenner, Corinthian Mizrahi C, RN Skin Integrity: Risk for impaired skin integrity will decrease 07/21/2017 0319 - Progressing by Peterson LombardKlenner, Burleigh Brockmann C, RN Education: Knowledge of the prescribed therapeutic regimen will improve 07/21/2017 0319 - Progressing by Peterson LombardKlenner, Mohit Zirbes C, RN Respiratory: Ability to maintain a clear airway will improve 07/21/2017 0319 - Progressing by Peterson LombardKlenner, Claudie Rathbone  C, RN Levels of oxygenation will improve 07/21/2017 0319 - Progressing by Peterson LombardKlenner, Gaby Harney C, RN

## 2017-07-21 NOTE — Progress Notes (Signed)
[facility] [place] [facility]   PATIENT NAME: Brian Sloan    MR#:  [id number]  DATE OF BIRTH:  19-Aug-1980  SUBJECTIVE:  CHIEF COMPLAINT: Patient is resting comfortably and reporting 2-year history of intermittent episodes of hemoptysis which has been worse for the past few days.  Now it is slowing down.  Denies any exposure to tuberculosis or weight loss or night sweats.  [facility] [place] [facility] [place] [facility] [place] and reports [facility] [place] but never had a bronchoscopy done  REVIEW OF SYSTEMS:  CONSTITUTIONAL: No fever, fatigue or weakness.  EYES: No blurred or double vision.  EARS, NOSE, AND THROAT: No tinnitus or ear pain.  RESPIRATORY: No cough, shortness of breath, wheezing or hemoptysis.  CARDIOVASCULAR: No chest pain, orthopnea, edema.  GASTROINTESTINAL: No nausea, vomiting, diarrhea or abdominal pain.  GENITOURINARY: No dysuria, hematuria.  ENDOCRINE: No polyuria, nocturia,  HEMATOLOGY: No anemia, easy bruising or bleeding SKIN: No rash or lesion. MUSCULOSKELETAL: No joint pain or arthritis.   NEUROLOGIC: No tingling, numbness, weakness.  PSYCHIATRY: No anxiety or depression.   DRUG ALLERGIES:  No Known Allergies  VITALS:  Blood pressure 115/72, pulse 67, temperature 97.6 F (36.4 C), temperature source Oral, resp. rate 20, height 5\' 8"  (1.727 m), weight 61.2 kg (135 lb), SpO2 98 %.  PHYSICAL EXAMINATION:  GENERAL:  37 y.o.-year-old patient lying in the bed with no acute distress.  EYES: Pupils equal, round, reactive to light and accommodation. No scleral icterus. Extraocular muscles intact.  HEENT: Head atraumatic, normocephalic. Oropharynx and nasopharynx clear.  NECK:  Supple, no jugular venous distention. No thyroid enlargement, no tenderness.  LUNGS: Normal breath sounds bilaterally, no wheezing, rales,rhonchi or crepitation. No use of accessory muscles of respiration.   CARDIOVASCULAR: S1, S2 normal. No murmurs, rubs, or gallops.  ABDOMEN: Soft, nontender, nondistended. Bowel sounds present. No organomegaly or mass.  EXTREMITIES: No pedal edema, cyanosis, or clubbing.  NEUROLOGIC: Cranial nerves II through XII are intact. Muscle strength 5/5 in all extremities. Sensation intact. Gait not checked.  PSYCHIATRIC: The patient is alert and oriented x 3.  SKIN: No obvious rash, lesion, or ulcer.    LABORATORY PANEL:   CBC Recent Labs  Lab 07/21/17 0500  WBC 5.9  HGB 13.9  HCT 42.1  PLT 219   ------------------------------------------------------------------------------------------------------------------  Chemistries  Recent Labs  Lab 07/20/17 0102 07/21/17 0500  NA 141 138  K 4.1 3.6  CL 104 106  CO2 27 24  GLUCOSE 101* 105*  BUN 13 10  CREATININE 0.88 0.75  CALCIUM 8.6* 8.0*  AST 18  --   ALT 15*  --   ALKPHOS 55  --   BILITOT 0.6  --    ------------------------------------------------------------------------------------------------------------------  Cardiac Enzymes No results for input(s): TROPONINI in the last 168 hours. ------------------------------------------------------------------------------------------------------------------  RADIOLOGY:  Ct Chest W Contrast  Result Date: 07/20/2017 CLINICAL DATA:  Recurrent hemoptysis. EXAM: CT CHEST WITH CONTRAST TECHNIQUE: Multidetector CT imaging of the chest was performed during intravenous contrast administration. CONTRAST:  75mL ISOVUE-300 IOPAMIDOL (ISOVUE-300) INJECTION 61% COMPARISON:  CTA chest dated June 26, 2017. FINDINGS: Cardiovascular: Normal heart size. No pericardial effusion. Normal caliber thoracic aorta. The right pulmonary arterial and venous system remains attenuated, similar to prior study. Tortuous, hypertrophy bronchial arteries are again noted arising from the aorta. Mediastinum/Nodes: Slight interval decrease in size of the right upper paratracheal lymph node,  now measuring 13 mm in short  axis, previously 19 mm. Borderline enlarged prevascular and subcarinal lymph nodes are unchanged. The mediastinum remains shifted to the right. The thyroid gland and esophagus are unremarkable. Lungs/Pleura: Unchanged volume loss in the right lung with hyperinflation of the left lung. Persistent asymmetric inter- and intralobular septal reticulonodular thickening, also involving the major and minor fissures, with scattered subpleural alveolar opacities and prominent peribronchial thickening throughout the right lung. This is similar appearance to prior study. Trace right pleural effusion. Small areas of mucous impaction in the left apex are unchanged. Small pulmonary nodules in the left upper lobe measuring 5-6 mm are unchanged and may represent mucous impaction. Upper Abdomen: No acute abnormality. Musculoskeletal: No chest wall abnormality. No acute or significant osseous findings. IMPRESSION: 1. Unchanged chronic volume loss in the right lung with reticulonodular intra- and interlobular septal thickening in a perilymphatic distribution. Given the unilateral involvement of the right lung with attenuation of the right pulmonary arterial and venous system, the patient may have suffered an early developmental insult and developed secondary chronic organizing pneumonia or atypical infection. Given the additional mediastinal adenopathy, lymphangitic carcinomatosis and sarcoidosis also remain in the differential. Pulmonary consultation, short-term follow-up chest CT in 3 months, and comparison with any prior outside imaging is recommended. 2. Tortuous, hypertrophied bronchial arteries again noted arising from the aorta. Electronically Signed   By: Obie Dredge M.D.   On: 07/20/2017 09:50    EKG:   Orders placed or performed during the hospital encounter of 07/20/17  . ED EKG  . ED EKG  . EKG 12-Lead  . EKG 12-Lead  . EKG 12-Lead  . EKG 12-Lead    ASSESSMENT AND PLAN:      37 year old male patient with history of COPD, hemoptysis in the past presented to the emergency room with coughing of blood.  1.   Acute on chronic Hemoptysis unclear etiology; could be from drug exposure/chronic infection/severe interstitial pneumonitis changes which could be hemorrhagic Clinically improving IV fluid hydration Advance diet as tolerated   pulmonary recommending outpatient bronchoscopy if hemoglobin is stable, scheduled for Thursday morning at this time Monitor hemoglobin hematocrit  2.  Emphysema Stable  3.  Tobacco abuse  tobacco cessation counseled to the patient  4.  History of drug abuse and alcohol abuse  outpatient follow-up with [facility]  4.  DVT prophylaxis Avoid blood thinner medication secondary to hemoptysis Sequential compression device to lower extremities for DVT prophylaxis       All the records are reviewed and case discussed with Care Management/Social Workerr. Management plans discussed with the patient, family and they are in agreement.  CODE STATUS: fc   TOTAL TIME TAKING CARE OF THIS PATIENT:  36  minutes.   POSSIBLE D/C IN 1 DAYS, DEPENDING ON CLINICAL CONDITION.  Note: This dictation was prepared with Dragon dictation along with smaller phrase technology. Any transcriptional errors that result from this process are unintentional.   Ramonita Lab M.D on 07/21/2017 at 3:09 PM  Between 7am to 6pm - Pager - [phone] After 6pm go to [email or web address] - [email or web address]  [facility]  Office  [phone]  CC: Primary care physician; Patient, No Pcp Per

## 2017-07-21 NOTE — H&P (View-Only) (Signed)
Addendum: Bronchoscopy scheduled earliest available is Thursday at 1 pm. If symptoms resolve/improve, he could be discharged and procedure could be performed as outpatient.   Wells Guileseep Estephani Popper, MD.

## 2017-07-21 NOTE — Plan of Care (Signed)
  Progressing Education: Knowledge of General Education information will improve 07/21/2017 1340 - Progressing by Luretha MurphyMiles, Yovany Clock, RN Health Behavior/Discharge Planning: Ability to manage health-related needs will improve 07/21/2017 1340 - Progressing by Luretha MurphyMiles, Salil Raineri, RN Clinical Measurements: Ability to maintain clinical measurements within normal limits will improve 07/21/2017 1340 - Progressing by Luretha MurphyMiles, Saber Dickerman, RN Will remain free from infection 07/21/2017 1340 - Progressing by Luretha MurphyMiles, Jasmynn Pfalzgraf, RN Diagnostic test results will improve 07/21/2017 1340 - Progressing by Luretha MurphyMiles, Aariah Godette, RN Respiratory complications will improve 07/21/2017 1340 - Progressing by Luretha MurphyMiles, Chaos Carlile, RN Cardiovascular complication will be avoided 07/21/2017 1340 - Progressing by Luretha MurphyMiles, Leisel Pinette, RN Activity: Risk for activity intolerance will decrease 07/21/2017 1340 - Progressing by Luretha MurphyMiles, Donnalee Cellucci, RN Nutrition: Adequate nutrition will be maintained 07/21/2017 1340 - Progressing by Luretha MurphyMiles, Tanny Harnack, RN Coping: Level of anxiety will decrease 07/21/2017 1340 - Progressing by Luretha MurphyMiles, Jillian Warth, RN Elimination: Will not experience complications related to bowel motility 07/21/2017 1340 - Progressing by Luretha MurphyMiles, Sir Mallis, RN Will not experience complications related to urinary retention 07/21/2017 1340 - Progressing by Luretha MurphyMiles, Yaeli Hartung, RN Pain Managment: General experience of comfort will improve 07/21/2017 1340 - Progressing by Luretha MurphyMiles, Sande Pickert, RN Safety: Ability to remain free from injury will improve 07/21/2017 1340 - Progressing by Luretha MurphyMiles, Pattye Meda, RN Skin Integrity: Risk for impaired skin integrity will decrease 07/21/2017 1340 - Progressing by Luretha MurphyMiles, Tanner Vigna, RN Education: Knowledge of the prescribed therapeutic regimen will improve 07/21/2017 1340 - Progressing by Luretha MurphyMiles, Kharee Lesesne, RN Respiratory: Ability to maintain a clear airway will improve 07/21/2017 1340 - Progressing by Luretha MurphyMiles, Farhiya Rosten, RN Levels of oxygenation will  improve 07/21/2017 1340 - Progressing by Luretha MurphyMiles, Unique Sillas, RN

## 2017-07-21 NOTE — Progress Notes (Signed)
Addendum: Bronchoscopy scheduled earliest available is Thursday at 1 pm. If symptoms resolve/improve, he could be discharged and procedure could be performed as outpatient.   Deep Jyoti Harju, MD.      
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
## 2017-07-22 LAB — CBC
HCT: 41.5 % (ref 40.0–52.0)
HEMOGLOBIN: 13.4 g/dL (ref 13.0–18.0)
MCH: 29.3 pg (ref 26.0–34.0)
MCHC: 32.4 g/dL (ref 32.0–36.0)
MCV: 90.4 fL (ref 80.0–100.0)
Platelets: 230 10*3/uL (ref 150–440)
RBC: 4.59 MIL/uL (ref 4.40–5.90)
RDW: 15.8 % — AB (ref 11.5–14.5)
WBC: 7 10*3/uL (ref 3.8–10.6)

## 2017-07-22 MED ORDER — ACETAMINOPHEN 325 MG PO TABS: 325.0000 mg | ORAL_TABLET | Freq: Four times a day (QID) | ORAL | Status: AC | PRN

## 2017-07-22 NOTE — Plan of Care (Signed)
  Progressing Education: Knowledge of General Education information will improve 07/22/2017 0236 - Progressing by Peterson LombardKlenner, Dlisa Barnwell C, RN Health Behavior/Discharge Planning: Ability to manage health-related needs will improve 07/22/2017 0236 - Progressing by Peterson LombardKlenner, Melaina Howerton C, RN Clinical Measurements: Ability to maintain clinical measurements within normal limits will improve 07/22/2017 0236 - Progressing by Peterson LombardKlenner, Kalix Meinecke C, RN Will remain free from infection 07/22/2017 0236 - Progressing by Peterson LombardKlenner, Nashton Belson C, RN Diagnostic test results will improve 07/22/2017 0236 - Progressing by Peterson LombardKlenner, Yvaine Jankowiak C, RN Respiratory complications will improve 07/22/2017 0236 - Progressing by Peterson LombardKlenner, Philander Ake C, RN Cardiovascular complication will be avoided 07/22/2017 0236 - Progressing by Peterson LombardKlenner, Reisa Coppola C, RN Activity: Risk for activity intolerance will decrease 07/22/2017 0236 - Progressing by Peterson LombardKlenner, Orra Nolde C, RN Nutrition: Adequate nutrition will be maintained 07/22/2017 0236 - Progressing by Peterson LombardKlenner, Anajah Sterbenz C, RN Coping: Level of anxiety will decrease 07/22/2017 0236 - Progressing by Peterson LombardKlenner, Danett Palazzo C, RN Elimination: Will not experience complications related to bowel motility 07/22/2017 0236 - Progressing by Peterson LombardKlenner, Nerea Bordenave C, RN Will not experience complications related to urinary retention 07/22/2017 0236 - Progressing by Peterson LombardKlenner, Jacquese Hackman C, RN Pain Managment: General experience of comfort will improve 07/22/2017 0236 - Progressing by Peterson LombardKlenner, Kitara Hebb C, RN Safety: Ability to remain free from injury will improve 07/22/2017 0236 - Progressing by Peterson LombardKlenner, Elener Custodio C, RN Skin Integrity: Risk for impaired skin integrity will decrease 07/22/2017 0236 - Progressing by Peterson LombardKlenner, Atlee Villers C, RN Education: Knowledge of the prescribed therapeutic regimen will improve 07/22/2017 0236 - Progressing by Peterson LombardKlenner, Khaiden Segreto C, RN Respiratory: Ability to maintain a clear airway will improve 07/22/2017 0236 - Progressing by Peterson LombardKlenner, Cabe Lashley  C, RN Levels of oxygenation will improve 07/22/2017 0236 - Progressing by Peterson LombardKlenner, Jarome Trull C, RN

## 2017-07-22 NOTE — Care Management Note (Signed)
Case Management Note  Patient Details  Name: Brian Sloan MRN: [id number] Date of Birth: [birth year]  Subjective/Objective:      Ad[facility] with the diagnosis of hemoptysis. Just moved to [place] 5 weeks ago from [place]. Lives with sister, Ardeen JourdainShannon Bolton [phone]) and mother. Patient's telephone number is [phone]  "Thinking about using [facility] on [address]  States he has no insurance, is applying for disability, and was assigned for a follow-up physician appointment 2 weeks ago when he was in the emergency room. Doesn't remember name of physician.  Takes care of all basic activities of daily living, can drive, if needed.             Discharge to home today per Dr. Amado CoeGouru. Family will transport  Action/Plan: Application for [facility] and Medication Management given. Will update Smiley HousemanLorrie Carter at [facility].    Expected Discharge Date:  07/22/17               Expected Discharge Plan:     In-House Referral:   yes  Discharge planning Services   yes  Post Acute Care Choice:    Choice offered to:     DME Arranged:    DME Agency:     HH Arranged:    HH Agency:     Status of Service:     If discussed at [employer] Meetings, dates discussed:    Additional Comments:  Gwenette GreetBrenda S Nakeya Adinolfi, RN MSN CCM Care Management [phone] 07/22/2017, 9:30 AM

## 2017-07-22 NOTE — Plan of Care (Signed)
  Progressing Education: Knowledge of General Education information will improve 07/22/2017 0940 - Progressing by Luretha MurphyMiles, Jhoana Upham, RN Health Behavior/Discharge Planning: Ability to manage health-related needs will improve 07/22/2017 0940 - Progressing by Luretha MurphyMiles, Philopater Mucha, RN Clinical Measurements: Ability to maintain clinical measurements within normal limits will improve 07/22/2017 0940 - Progressing by Luretha MurphyMiles, Ashya Nicolaisen, RN Will remain free from infection 07/22/2017 0940 - Progressing by Luretha MurphyMiles, Aaban Griep, RN Diagnostic test results will improve 07/22/2017 0940 - Progressing by Luretha MurphyMiles, Hoa Deriso, RN Respiratory complications will improve 07/22/2017 0940 - Progressing by Luretha MurphyMiles, Lakeria Starkman, RN Cardiovascular complication will be avoided 07/22/2017 0940 - Progressing by Luretha MurphyMiles, Aamira Bischoff, RN Activity: Risk for activity intolerance will decrease 07/22/2017 0940 - Progressing by Luretha MurphyMiles, Yamina Lenis, RN Nutrition: Adequate nutrition will be maintained 07/22/2017 0940 - Progressing by Luretha MurphyMiles, Dorrene Bently, RN Coping: Level of anxiety will decrease 07/22/2017 0940 - Progressing by Luretha MurphyMiles, Amario Longmore, RN Elimination: Will not experience complications related to bowel motility 07/22/2017 0940 - Progressing by Luretha MurphyMiles, Grainne Knights, RN Will not experience complications related to urinary retention 07/22/2017 0940 - Progressing by Luretha MurphyMiles, Viviane Semidey, RN Pain Managment: General experience of comfort will improve 07/22/2017 0940 - Progressing by Luretha MurphyMiles, Jamonica Schoff, RN Safety: Ability to remain free from injury will improve 07/22/2017 0940 - Progressing by Luretha MurphyMiles, Manahil Vanzile, RN Skin Integrity: Risk for impaired skin integrity will decrease 07/22/2017 0940 - Progressing by Luretha MurphyMiles, Alexie Samson, RN Education: Knowledge of the prescribed therapeutic regimen will improve 07/22/2017 0940 - Progressing by Luretha MurphyMiles, Jondavid Schreier, RN Respiratory: Ability to maintain a clear airway will improve 07/22/2017 0940 - Progressing by Luretha MurphyMiles, Denica Web, RN Levels of oxygenation will  improve 07/22/2017 0940 - Progressing by Luretha MurphyMiles, Zackarey Holleman, RN

## 2017-07-22 NOTE — Discharge Summary (Signed)
[facility] [place] [facility]   PATIENT NAME: Brian Sloan    MR#:  [id number]  DATE OF BIRTH:  [birth year]  DATE OF ADMISSION:  07/20/2017 ADMITTING PHYSICIAN: Ihor Austin, MD  DATE OF DISCHARGE:  07/22/17  PRIMARY CARE PHYSICIAN: Patient, No Pcp Per    ADMISSION DIAGNOSIS:  Hemoptysis [R04.2]  DISCHARGE DIAGNOSIS:  Active Problems:   Hemoptysis   SECONDARY DIAGNOSIS:   Past Medical History:  Diagnosis Date  . COPD (chronic obstructive pulmonary disease) (HCC)   . Hemoptysis     HOSPITAL COURSE:  HISTORY OF PRESENT ILLNESS: Brian Sloan  is a 37 y.o. male with a known history of COPD, hemoptysis in the past presented to the emergency room for coughing up blood late last night and this morning.  Patient had coughed up blood.  No vomiting of blood or any rectal bleed.  He was evaluated in February for hemoptysis and was seen by pulmonary attending Dr. Welton Flakes .Hemoglobin is stable after presentation to the emergency room.  No new episodes of coughing up blood in the emergency room.  Case was discussed with pulmonary attending by ER physician who recommended observation admission.  No complaints of any chest pain, shortness of breath.  No difficulty swallowing food.  No fever and chills.  Was worked up in the emergency room with  and CT scan of the chest showed chronic volume loss in the right lung along with mediastinal adenopathy.  1.  Acute on chronicHemoptysis unclear etiology; could be from drug exposure/chronic infection/severe interstitial pneumonitis changes which could be hemorrhagic Clinically improved IV fluid hydration provided Advanced diet as tolerated  pulmonary recommending outpatient bronchoscopy, scheduled for Thursday morning Stable hemoglobin hematocrit  2.Emphysema Stable  3.Tobacco abuse tobacco cessation provided, counseled patient to quit smoking for 5 minutes.  He verbalized understanding.  He will consider using  nicotine patch but not ready at this time   4.  History of drug abuse and alcohol abuse  outpatient follow-up with [facility]  4.DVT prophylaxis Avoid blood thinner medication secondary to hemoptysis Sequential compression device to lower extremities for DVT prophylaxis    DISCHARGE CONDITIONS:   stable  CONSULTS OBTAINED:     PROCEDURES  None   DRUG ALLERGIES:  No Known Allergies  DISCHARGE MEDICATIONS:   Allergies as of 07/22/2017   No Known Allergies     Medication List    TAKE these medications   acetaminophen 325 MG tablet Commonly known as:  TYLENOL Take 1 tablet (325 mg total) by mouth every 6 (six) hours as needed for mild pain (or Fever >/= 101).   albuterol 108 (90 Base) MCG/ACT inhaler Commonly known as:  PROVENTIL HFA;VENTOLIN HFA Inhale 2 puffs into the lungs every 6 (six) hours as needed for wheezing or shortness of breath.   FLUoxetine 20 MG capsule Commonly known as:  PROZAC Take 1 capsule by mouth every morning.   Fluticasone-Salmeterol 250-50 MCG/DOSE Aepb Commonly known as:  ADVAIR Inhale 1 puff into the lungs 2 (two) times daily.   gabapentin 300 MG capsule Commonly known as:  NEURONTIN Take 300 mg by mouth 2 (two) times daily.   montelukast 10 MG tablet Commonly known as:  SINGULAIR Take 10 mg by mouth daily.   nicotine 21 mg/24hr patch Commonly known as:  NICODERM CQ - dosed in mg/24 hours Place 1 patch (21 mg total) onto the skin daily.   tiotropium 18 MCG inhalation capsule Commonly known as:  SPIRIVA  HANDIHALER Place 1 capsule (18 mcg total) into inhaler and inhale daily.        DISCHARGE INSTRUCTIONS:   Follow-up with primary care physician in a week   follow-up with pulmonology Dr. Nicholos Johns for bronchoscopy  07/24/2017 at 1 PM  DIET:  Regular diet  DISCHARGE CONDITION:  Stable  ACTIVITY:  Activity as tolerated  OXYGEN:  Home Oxygen: No.   Oxygen Delivery: room  air  DISCHARGE LOCATION:  home   If you experience worsening of your admission symptoms, develop shortness of breath, life threatening emergency, suicidal or homicidal thoughts you must seek medical attention immediately by calling 911 or calling your MD immediately  if symptoms less severe.  You Must read complete instructions/literature along with all the possible adverse reactions/side effects for all the Medicines you take and that have been prescribed to you. Take any new Medicines after you have completely understood and accpet all the possible adverse reactions/side effects.   Please note  You were cared for by a hospitalist during your hospital stay. If you have any questions about your discharge medications or the care you received while you were in the hospital after you are discharged, you can call the unit and asked to speak with the hospitalist on call if the hospitalist that took care of you is not available. Once you are discharged, your primary care physician will handle any further medical issues. Please note that NO REFILLS for any discharge medications will be authorized once you are discharged, as it is imperative that you return to your primary care physician (or establish a relationship with a primary care physician if you do not have one) for your aftercare needs so that they can reassess your need for medications and monitor your lab values.     Today  Chief Complaint  Patient presents with  . Hemoptysis   Patient is resting comfortably.  Hemoptysis significantly improved.  Denies any chest pain or shortness of breath.  Okay to discharge patient from pulmonology standpoint.  Scheduled to get a bronchoscopy on Thursday morning at 1 PM.  Discharge patient home  ROS:  CONSTITUTIONAL: Denies fevers, chills. Denies any fatigue, weakness.  EYES: Denies blurry vision, double vision, eye pain. EARS, NOSE, THROAT: Denies tinnitus, ear pain, hearing loss. RESPIRATORY: Denies  cough, wheeze, shortness of breath.  CARDIOVASCULAR: Denies chest pain, palpitations, edema.  GASTROINTESTINAL: Denies nausea, vomiting, diarrhea, abdominal pain. Denies bright red blood per rectum. GENITOURINARY: Denies dysuria, hematuria. ENDOCRINE: Denies nocturia or thyroid problems. HEMATOLOGIC AND LYMPHATIC: Denies easy bruising or bleeding. SKIN: Denies rash or lesion. MUSCULOSKELETAL: Denies pain in neck, back, shoulder, knees, hips or arthritic symptoms.  NEUROLOGIC: Denies paralysis, paresthesias.  PSYCHIATRIC: Denies anxiety or depressive symptoms.   VITAL SIGNS:  Blood pressure 117/72, pulse 76, temperature 98 F (36.7 C), temperature source Oral, resp. rate 20, height 5\' 8"  (1.727 m), weight 61.2 kg (135 lb), SpO2 100 %.  I/O:    Intake/Output Summary (Last 24 hours) at 07/22/2017 1309 Last data filed at 07/22/2017 0900 Gross per 24 hour  Intake 3927.33 ml  Output -  Net 3927.33 ml    PHYSICAL EXAMINATION:  GENERAL:  37 y.o.-year-old patient lying in the bed with no acute distress.  EYES: Pupils equal, round, reactive to light and accommodation. No scleral icterus. Extraocular muscles intact.  HEENT: Head atraumatic, normocephalic. Oropharynx and nasopharynx clear.  NECK:  Supple, no jugular venous distention. No thyroid enlargement, no tenderness.  LUNGS: Normal breath sounds bilaterally, no wheezing, rales,rhonchi  or crepitation. No use of accessory muscles of respiration.  CARDIOVASCULAR: S1, S2 normal. No murmurs, rubs, or gallops.  ABDOMEN: Soft, non-tender, non-distended. Bowel sounds present. No organomegaly or mass.  EXTREMITIES: No pedal edema, cyanosis, or clubbing.  Multiple tattoos on his extremities and torso NEUROLOGIC: Cranial nerves II through XII are intact. Muscle strength 5/5 in all extremities. Sensation intact. Gait not checked.  PSYCHIATRIC: The patient is alert and oriented x 3.  SKIN: No obvious rash, lesion, or ulcer.   DATA REVIEW:    CBC Recent Labs  Lab 07/22/17 0645  WBC 7.0  HGB 13.4  HCT 41.5  PLT 230    Chemistries  Recent Labs  Lab 07/20/17 0102 07/21/17 0500  NA 141 138  K 4.1 3.6  CL 104 106  CO2 27 24  GLUCOSE 101* 105*  BUN 13 10  CREATININE 0.88 0.75  CALCIUM 8.6* 8.0*  AST 18  --   ALT 15*  --   ALKPHOS 55  --   BILITOT 0.6  --     Cardiac Enzymes No results for input(s): TROPONINI in the last 168 hours.  Microbiology Results  Results for orders placed or performed during the hospital encounter of 06/25/17  Culture, blood (routine x 2)     Status: None   Collection Time: 06/26/17  1:56 AM  Result Value Ref Range Status   Specimen Description BLOOD LEFT AC  Final   Special Requests   Final    BOTTLES DRAWN AEROBIC AND ANAEROBIC Blood Culture results may not be optimal due to an excessive volume of blood received in culture bottles   Culture   Final    NO GROWTH 5 DAYS Performed at [facility], [address] [place], [place] [address]    Report Status 07/01/2017 FINAL  Final  Culture, blood (routine x 2)     Status: None   Collection Time: 06/26/17  1:56 AM  Result Value Ref Range Status   Specimen Description BLOOD LEFT FA  Final   Special Requests   Final    BOTTLES DRAWN AEROBIC AND ANAEROBIC Blood Culture adequate volume   Culture   Final    NO GROWTH 5 DAYS Performed at [facility], [address] [place], [place] [address]    Report Status 07/01/2017 FINAL  Final  Respiratory Panel by PCR     Status: None   Collection Time: 06/27/17  4:25 PM  Result Value Ref Range Status   Adenovirus NOT DETECTED NOT DETECTED Final   Coronavirus 229E NOT DETECTED NOT DETECTED Final   Coronavirus HKU1 NOT DETECTED NOT DETECTED Final   Coronavirus NL63 NOT DETECTED NOT DETECTED Final   Coronavirus OC43 NOT DETECTED NOT DETECTED Final   Metapneumovirus NOT DETECTED NOT DETECTED Final   Rhinovirus / Enterovirus NOT DETECTED NOT DETECTED Final   Influenza A  NOT DETECTED NOT DETECTED Final   Influenza B NOT DETECTED NOT DETECTED Final   Parainfluenza Virus 1 NOT DETECTED NOT DETECTED Final   Parainfluenza Virus 2 NOT DETECTED NOT DETECTED Final   Parainfluenza Virus 3 NOT DETECTED NOT DETECTED Final   Parainfluenza Virus 4 NOT DETECTED NOT DETECTED Final   Respiratory Syncytial Virus NOT DETECTED NOT DETECTED Final   Bordetella pertussis NOT DETECTED NOT DETECTED Final   Chlamydophila pneumoniae NOT DETECTED NOT DETECTED Final   Mycoplasma pneumoniae NOT DETECTED NOT DETECTED Final    Comment: Performed at [facility], [address] [place], [place] [address]  Culture, expectorated sputum-assessment     Status: None   Collection Time: 06/27/17  4:32 PM  Result Value Ref Range Status   Specimen Description EXPECTORATED SPUTUM  Final   Special Requests Normal  Final   Sputum evaluation   Final    THIS SPECIMEN IS ACCEPTABLE FOR SPUTUM CULTURE Performed at [facility], [address] [place], [place] [address]    Report Status 06/27/2017 FINAL  Final  Culture, respiratory (NON-Expectorated)     Status: None   Collection Time: 06/27/17  4:32 PM  Result Value Ref Range Status   Specimen Description   Final    EXPECTORATED SPUTUM Performed at [facility], [address] [place], [place] [address]    Special Requests   Final    Normal Reflexed from [id number] Performed at [facility], [address] [place], [place] [address]    Gram Stain   Final    FEW WBC PRESENT, PREDOMINANTLY PMN RARE SQUAMOUS EPITHELIAL CELLS PRESENT FEW GRAM POSITIVE COCCI IN PAIRS RARE GRAM NEGATIVE RODS RARE BUDDING YEAST SEEN    Culture   Final    Consistent with normal respiratory flora. Performed at [facility], [address] [place], [place] [address]    Report Status 06/30/2017 FINAL  Final  Acid Fast Smear (AFB)     Status: None   Collection Time: 06/27/17  9:04 PM  Result Value Ref Range Status    AFB Specimen Processing Concentration  Final   Acid Fast Smear Negative  Final    Comment: (NOTE) Performed At: [facility] [place] [address] [place], [place] [id number] Jolene SchimkeNagendra Sanjai MD CZ:[id number]:[phone]    Source (AFB) EXPECTORATED SPUTUM  Final    Comment: Performed at [facility], [address] [place], [place] [address]  Acid Fast Smear (AFB)     Status: None   Collection Time: 06/28/17  1:19 PM  Result Value Ref Range Status   AFB Specimen Processing Concentration  Final   Acid Fast Smear Negative  Final    Comment: (NOTE) Performed At: [facility] [place] [address] [place], [place] [id number] Jolene SchimkeNagendra Sanjai MD [place]:[id number]:[phone]    Source (AFB) SPUTUM  Final    Comment: Performed at [facility], [address] [place], [place] [address]  Acid Fast Smear (AFB)     Status: None   Collection Time: 06/28/17  9:16 PM  Result Value Ref Range Status   AFB Specimen Processing Concentration  Final   Acid Fast Smear Negative  Final    Comment: (NOTE) Performed At: [facility] [place] [address] [place], [place] [id number] Jolene SchimkeNagendra Sanjai MD YI:[id number]:[phone]    Source (AFB) EXPECTORATED SPUTUM  Final    Comment: Performed at [facility], [address] [place], [place] [address]    RADIOLOGY:  Ct Chest W Contrast  Result Date: 07/20/2017 CLINICAL DATA:  Recurrent hemoptysis. EXAM: CT CHEST WITH CONTRAST TECHNIQUE: Multidetector CT imaging of the chest was performed during intravenous contrast administration. CONTRAST:  75mL ISOVUE-300 IOPAMIDOL (ISOVUE-300) INJECTION 61% COMPARISON:  CTA chest dated June 26, 2017. FINDINGS: Cardiovascular: Normal heart size. No pericardial effusion. Normal caliber thoracic aorta. The right pulmonary arterial and venous system remains attenuated, similar to prior study. Tortuous, hypertrophy bronchial arteries are again noted arising from the aorta. Mediastinum/Nodes: Slight interval decrease in size of  the right upper paratracheal lymph node, now measuring 13 mm in short axis, previously 19 mm. Borderline enlarged prevascular and subcarinal lymph nodes  are unchanged. The mediastinum remains shifted to the right. The thyroid gland and esophagus are unremarkable. Lungs/Pleura: Unchanged volume loss in the right lung with hyperinflation of the left lung. Persistent asymmetric inter- and intralobular septal reticulonodular thickening, also involving the major and minor fissures, with scattered subpleural alveolar opacities and prominent peribronchial thickening throughout the right lung. This is similar appearance to prior study. Trace right pleural effusion. Small areas of mucous impaction in the left apex are unchanged. Small pulmonary nodules in the left upper lobe measuring 5-6 mm are unchanged and may represent mucous impaction. Upper Abdomen: No acute abnormality. Musculoskeletal: No chest wall abnormality. No acute or significant osseous findings. IMPRESSION: 1. Unchanged chronic volume loss in the right lung with reticulonodular intra- and interlobular septal thickening in a perilymphatic distribution. Given the unilateral involvement of the right lung with attenuation of the right pulmonary arterial and venous system, the patient may have suffered an early developmental insult and developed secondary chronic organizing pneumonia or atypical infection. Given the additional mediastinal adenopathy, lymphangitic carcinomatosis and sarcoidosis also remain in the differential. Pulmonary consultation, short-term follow-up chest CT in 3 months, and comparison with any prior outside imaging is recommended. 2. Tortuous, hypertrophied bronchial arteries again noted arising from the aorta. Electronically Signed   By: Obie Dredge M.D.   On: 07/20/2017 09:50    EKG:   Orders placed or performed during the hospital encounter of 07/20/17  . ED EKG  . ED EKG  . EKG 12-Lead  . EKG 12-Lead  . EKG 12-Lead  . EKG  12-Lead      Management plans discussed with the patient, family and they are in agreement.  CODE STATUS:     Code Status Orders  (From admission, onward)        Start     Ordered   07/20/17 1052  Full code  Continuous     07/20/17 1051    Code Status History    Date Active Date Inactive Code Status Order ID Comments User Context   06/26/2017 08:17 07/01/2017 20:13 Full Code [id number]  Ihor Austin, MD Inpatient      TOTAL TIME TAKING CARE OF THIS PATIENT: 42 minutes.   Note: This dictation was prepared with Dragon dictation along with smaller phrase technology. Any transcriptional errors that result from this process are unintentional.   @[facility] @  on 07/22/2017 at 1:09 PM  Between 7am to 6pm - Pager - [phone]  After 6pm go to [email or web address] - password [email or web address]  [facility]  Office  [phone]  CC: Primary care physician; Patient, No Pcp Per

## 2017-07-22 NOTE — Discharge Instructions (Signed)
Follow-up with primary care physician in a week   follow-up with pulmonology Dr. Nicholos Johnsamachandran for bronchoscopy  07/24/2017 at 1 PM

## 2017-07-23 ENCOUNTER — Encounter
Admission: RE | Admit: 2017-07-23 | Discharge: 2017-07-23 | Disposition: A | Discharge status: Home / Self Care | Payer: Self-pay | Source: Ambulatory Visit | Attending: Internal Medicine | Admitting: Internal Medicine

## 2017-07-23 HISTORY — DX: Family history of other specified conditions: Z84.89

## 2017-07-23 HISTORY — DX: Personal history of Methicillin resistant Staphylococcus aureus infection: Z86.14

## 2017-07-23 NOTE — Patient Instructions (Addendum)
Your procedure is scheduled on: 07-24-17  Report to Same Day Surgery 2nd floor medical mall [facility](Medical Mall Entrance-take elevator on left to 2nd floor.  Check in with surgery information desk.) To find out your arrival time please call [phone] between 1PM - 3PM on 07-23-17  Remember: Instructions that are not followed completely may result in serious medical risk, up to and including death, or upon the discretion of your surgeon and anesthesiologist your surgery may need to be rescheduled.    _x___ 1. Do not eat food after midnight the night before your procedure. You may drink clear liquids up to 2 hours before you are scheduled to arrive at the hospital for your procedure.  Do not drink clear liquids within 2 hours of your scheduled arrival to the hospital.  Clear liquids include  --Water or Apple juice without pulp  --Clear carbohydrate beverage such as ClearFast or Gatorade  --Black Coffee or Clear Tea (No milk, no creamers, do not add anything to  the coffee or Tea Type 1 and type 2 diabetics should only drink water.  No gum chewing or hard candies.     __x__ 2. No Alcohol for 24 hours before or after surgery.   __x__3. No Smoking or e-cigarettes for 24 prior to surgery.  Do not use any chewable tobacco products for at least 6 hour prior to surgery   ____  4. Bring all medications with you on the day of surgery if instructed.    __x__ 5. Notify your doctor if there is any change in your medical condition     (cold, fever, infections).    x___6. On the morning of surgery brush your teeth with toothpaste and water.  You may rinse your mouth with mouth wash if you wish.  Do not swallow any toothpaste or mouthwash.   Do not wear jewelry, make-up, hairpins, clips or nail polish.  Do not wear lotions, powders, or perfumes. You may wear deodorant.  Do not shave 48 hours prior to surgery. Men may shave face and neck.  Do not bring valuables to the hospital.    [facility] is not  responsible for any belongings or valuables.               Contacts, dentures or bridgework may not be worn into surgery.  Leave your suitcase in the car. After surgery it may be brought to your room.  For patients admitted to the hospital, discharge time is determined by your treatment team.  _  Patients discharged the day of surgery will not be allowed to drive home.  You will need someone to drive you home and stay with you the night of your procedure.    Please read over the following fact sheets that you were given:   [facility] Preparing for [facility] and or MRSA Information   _x___ TAKE THE FOLLOWING MEDICATION THE MORNING OF SURGERY WITH A SMALL SIP OF WATER. These include:  1. PROZAC  2. SINGULAIR  3. GABAPENTIN  4.  5.  6.  ____Fleets enema or Magnesium Citrate as directed.   ____ Use CHG Soap or sage wipes as directed on instruction sheet   _X___ Use inhalers on the day of surgery and bring to hospital day of surgery-USE ADVAIR, SPIRIVA AND ALBUTEROL INHALER AT HOME AND BRING ALBUTEROL INHALER TO HOSPITAL  ____ Stop Metformin and Janumet 2 days prior to surgery.    ____ Take 1/2 of usual insulin dose the night before surgery and  none on the morning     surgery.   ____ Follow recommendations from Cardiologist, Pulmonologist or PCP regarding          stopping Aspirin, Coumadin, Plavix ,Eliquis, Effient, or Pradaxa, and Pletal.  X____Stop Anti-inflammatories such as Advil, Aleve, Ibuprofen, Motrin, Naproxen, Naprosyn, Goodies powders or aspirin products NOW-OK to take Tylenol    ____ Stop supplements until after surgery.     ____ Bring C-Pap to the hospital.

## 2017-07-24 ENCOUNTER — Encounter: Payer: Self-pay | Admitting: Emergency Medicine

## 2017-07-24 ENCOUNTER — Encounter
Admission: RE | Disposition: A | Discharge status: Home / Self Care | Payer: Self-pay | Source: Ambulatory Visit | Attending: Internal Medicine

## 2017-07-24 ENCOUNTER — Ambulatory Visit: Payer: Self-pay

## 2017-07-24 ENCOUNTER — Ambulatory Visit: Payer: Self-pay | Admitting: Anesthesiology

## 2017-07-24 ENCOUNTER — Ambulatory Visit
Admission: RE | Admit: 2017-07-24 | Discharge: 2017-07-24 | Disposition: A | Discharge status: Home / Self Care | Payer: Self-pay | Source: Ambulatory Visit | Attending: Internal Medicine | Admitting: Internal Medicine

## 2017-07-24 DIAGNOSIS — J189 Pneumonia, unspecified organism: Secondary | ICD-10-CM | POA: Insufficient documentation

## 2017-07-24 DIAGNOSIS — Z9889 Other specified postprocedural states: Secondary | ICD-10-CM

## 2017-07-24 DIAGNOSIS — J449 Chronic obstructive pulmonary disease, unspecified: Secondary | ICD-10-CM | POA: Insufficient documentation

## 2017-07-24 DIAGNOSIS — F17200 Nicotine dependence, unspecified, uncomplicated: Secondary | ICD-10-CM | POA: Insufficient documentation

## 2017-07-24 DIAGNOSIS — R918 Other nonspecific abnormal finding of lung field: Secondary | ICD-10-CM

## 2017-07-24 HISTORY — PX: VIDEO BRONCHOSCOPY WITH ENDOBRONCHIAL ULTRASOUND: SHX6177

## 2017-07-24 LAB — URINE DRUG SCREEN, QUALITATIVE (ARMC ONLY)
AMPHETAMINES, UR SCREEN: NOT DETECTED
BENZODIAZEPINE, UR SCRN: NOT DETECTED
Barbiturates, Ur Screen: NOT DETECTED
Cannabinoid 50 Ng, Ur ~~LOC~~: NOT DETECTED
Cocaine Metabolite,Ur ~~LOC~~: NOT DETECTED
MDMA (ECSTASY) UR SCREEN: NOT DETECTED
METHADONE SCREEN, URINE: NOT DETECTED
Opiate, Ur Screen: NOT DETECTED
PHENCYCLIDINE (PCP) UR S: NOT DETECTED
TRICYCLIC, UR SCREEN: NOT DETECTED

## 2017-07-24 SURGERY — BRONCHOSCOPY, WITH EBUS
Anesthesia: General | Laterality: Right

## 2017-07-24 MED ORDER — FENTANYL CITRATE (PF) 100 MCG/2ML IJ SOLN: 25.0000 ug | INTRAMUSCULAR | Status: DC | PRN

## 2017-07-24 MED ORDER — DEXAMETHASONE SODIUM PHOSPHATE 10 MG/ML IJ SOLN
INTRAMUSCULAR | Status: AC
  Filled 2017-07-24: qty 1

## 2017-07-24 MED ORDER — FENTANYL CITRATE (PF) 100 MCG/2ML IJ SOLN
INTRAMUSCULAR | Status: AC
  Filled 2017-07-24: qty 2

## 2017-07-24 MED ORDER — HYDROCODONE-ACETAMINOPHEN 7.5-325 MG PO TABS: 1.0000 | ORAL_TABLET | Freq: Once | ORAL | Status: DC | PRN

## 2017-07-24 MED ORDER — LACTATED RINGERS IV SOLN
INTRAVENOUS | Status: DC
  Administered 2017-07-24: 13:00:00 via INTRAVENOUS

## 2017-07-24 MED ORDER — FAMOTIDINE 20 MG PO TABS
ORAL_TABLET | ORAL | Status: AC
  Filled 2017-07-24: qty 1

## 2017-07-24 MED ORDER — ONDANSETRON HCL 4 MG/2ML IJ SOLN
INTRAMUSCULAR | Status: DC | PRN
  Administered 2017-07-24: 4 mg via INTRAVENOUS

## 2017-07-24 MED ORDER — ONDANSETRON HCL 4 MG/2ML IJ SOLN
INTRAMUSCULAR | Status: AC
  Filled 2017-07-24: qty 2

## 2017-07-24 MED ORDER — ROCURONIUM BROMIDE 50 MG/5ML IV SOLN
INTRAVENOUS | Status: AC
  Filled 2017-07-24: qty 1

## 2017-07-24 MED ORDER — ROCURONIUM BROMIDE 100 MG/10ML IV SOLN
INTRAVENOUS | Status: DC | PRN
  Administered 2017-07-24: 5 mg via INTRAVENOUS
  Administered 2017-07-24: 15 mg via INTRAVENOUS

## 2017-07-24 MED ORDER — FENTANYL CITRATE (PF) 100 MCG/2ML IJ SOLN
INTRAMUSCULAR | Status: DC | PRN
  Administered 2017-07-24: 100 ug via INTRAVENOUS

## 2017-07-24 MED ORDER — PHENYLEPHRINE HCL 10 MG/ML IJ SOLN
INTRAMUSCULAR | Status: DC | PRN
  Administered 2017-07-24: 50 ug via INTRAVENOUS
  Administered 2017-07-24: 100 ug via INTRAVENOUS
  Administered 2017-07-24: 50 ug via INTRAVENOUS

## 2017-07-24 MED ORDER — SUCCINYLCHOLINE CHLORIDE 20 MG/ML IJ SOLN
INTRAMUSCULAR | Status: AC
Start: 2017-07-24 — End: ?
  Filled 2017-07-24: qty 1

## 2017-07-24 MED ORDER — PROPOFOL 10 MG/ML IV BOLUS
INTRAVENOUS | Status: DC | PRN
  Administered 2017-07-24: 170 mg via INTRAVENOUS

## 2017-07-24 MED ORDER — LIDOCAINE HCL (PF) 1 % IJ SOLN
INTRAMUSCULAR | Status: AC
  Filled 2017-07-24: qty 5

## 2017-07-24 MED ORDER — SUGAMMADEX SODIUM 200 MG/2ML IV SOLN
INTRAVENOUS | Status: DC | PRN
  Administered 2017-07-24: 120 mg via INTRAVENOUS

## 2017-07-24 MED ORDER — DEXAMETHASONE SODIUM PHOSPHATE 10 MG/ML IJ SOLN
INTRAMUSCULAR | Status: DC | PRN
  Administered 2017-07-24: 5 mg via INTRAVENOUS

## 2017-07-24 MED ORDER — PROMETHAZINE HCL 25 MG/ML IJ SOLN: 6.2500 mg | INTRAMUSCULAR | Status: DC | PRN

## 2017-07-24 MED ORDER — MIDAZOLAM HCL 2 MG/2ML IJ SOLN
INTRAMUSCULAR | Status: AC
  Filled 2017-07-24: qty 2

## 2017-07-24 MED ORDER — SUCCINYLCHOLINE CHLORIDE 20 MG/ML IJ SOLN
INTRAMUSCULAR | Status: DC | PRN
  Administered 2017-07-24: 100 mg via INTRAVENOUS

## 2017-07-24 MED ORDER — MIDAZOLAM HCL 5 MG/5ML IJ SOLN
INTRAMUSCULAR | Status: DC | PRN
  Administered 2017-07-24: 2 mg via INTRAVENOUS

## 2017-07-24 MED ORDER — LIDOCAINE HCL (CARDIAC) 20 MG/ML IV SOLN
INTRAVENOUS | Status: DC | PRN
  Administered 2017-07-24: 60 mg via INTRAVENOUS

## 2017-07-24 MED ORDER — FAMOTIDINE 20 MG PO TABS
20.0000 mg | ORAL_TABLET | Freq: Once | ORAL | Status: AC
  Administered 2017-07-24: 20 mg via ORAL

## 2017-07-24 MED ORDER — SUGAMMADEX SODIUM 200 MG/2ML IV SOLN
INTRAVENOUS | Status: AC
  Filled 2017-07-24: qty 2

## 2017-07-24 MED ORDER — PROPOFOL 10 MG/ML IV BOLUS
INTRAVENOUS | Status: AC
  Filled 2017-07-24: qty 20

## 2017-07-24 NOTE — Interval H&P Note (Signed)
History and Physical Interval Note:  07/24/2017 12:33 PM  Brian FootsAaron Sloan  has presented today for surgery, with the diagnosis of hemoptysis  The various methods of treatment have been discussed with the patient and family. After consideration of risks, benefits and other options for treatment, the patient has consented to  Procedure(s): VIDEO BRONCHOSCOPY WITH ENDOBRONCHIAL ULTRASOUND (Right) as a surgical intervention .  The patient's history has been reviewed, patient examined, no change in status, stable for surgery.  I have reviewed the patient's chart and labs.  Questions were answered to the patient's satisfaction.     Shane CrutchPradeep Melah Ebling

## 2017-07-24 NOTE — Anesthesia Preprocedure Evaluation (Signed)
Anesthesia Evaluation  Patient identified by MRN, date of birth, ID band Patient awake    Reviewed: Allergy & Precautions, H&P , NPO status , reviewed documented beta blocker date and time   History of Anesthesia Complications (+) Family history of anesthesia reaction  Airway Mallampati: I  TM Distance: >3 FB     Dental  (+) Chipped, Dental Advidsory Given   Pulmonary COPD, Current Smoker,    Pulmonary exam normal        Cardiovascular Normal cardiovascular exam     Neuro/Psych    GI/Hepatic   Endo/Other    Renal/GU      Musculoskeletal   Abdominal   Peds  Hematology   Anesthesia Other Findings   Reproductive/Obstetrics                             Anesthesia Physical Anesthesia Plan  ASA: II  Anesthesia Plan: General ETT   Post-op Pain Management:    Induction:   PONV Risk Score and Plan: 2 and Propofol infusion, Ondansetron and Midazolam  Airway Management Planned:   Additional Equipment:   Intra-op Plan:   Post-operative Plan:   Informed Consent: I have reviewed the patients History and Physical, chart, labs and discussed the procedure including the risks, benefits and alternatives for the proposed anesthesia with the patient or authorized representative who has indicated his/her understanding and acceptance.   Dental Advisory Given  Plan Discussed with: CRNA  Anesthesia Plan Comments:         Anesthesia Quick Evaluation

## 2017-07-24 NOTE — Transfer of Care (Signed)
Immediate Anesthesia Transfer of Care Note  Patient: Brian Sloan  Procedure(s) Performed: VIDEO BRONCHOSCOPY WITH ENDOBRONCHIAL ULTRASOUND (Right )  Patient Location: PACU  Anesthesia Type:General  Level of Consciousness: sedated  Airway & Oxygen Therapy: Patient Spontanous Breathing and Patient connected to face mask oxygen  Post-op Assessment: Report given to RN and Post -op Vital signs reviewed and stable  Post vital signs: Reviewed and stable  Last Vitals:  Vitals:   07/24/17 1236 07/24/17 1445  BP: 120/80 115/65  Pulse: 77 83  Resp: 17 (!) 30  Temp: 36.4 C (!) 36.3 C  SpO2: 99% 99%    Last Pain:  Vitals:   07/24/17 1445  TempSrc:   PainSc: Asleep         Complications: No apparent anesthesia complications

## 2017-07-24 NOTE — Op Note (Signed)
  [facility]            Bronchoscopy Note   FINDINGS/SUMMARY:   -Diffuse severe bronchial mucosal erythema edema and bronchitis. -Severely friable mucosa which bled easily and excessively upon contact.  Much of the mucosa of the central airways had visible subcutaneous vessels, which bled easily, much worse on the right side. -Transbronchial fluoroscopic right lower lobe biopsies obtained, transbronchial cytology brushes obtained under fluoroscopic guidance, right lower lobe washings taken. - EBUS guided subcarinal lymph node sampling.  Small lymphadenopathy was seen in the right hilar and right paratracheal areas but these were inaccessible and had adjacent vessels. -Abnormal mucosa in the main carina forceps and a bronchial biopsy taken x1. Criss Rosales pulmonary hemorrhage with mucosal edema and bleeding throughout the right lung, no specific area of bleeding was identified.   Indication: lung scarring.  The patient (or their representative) was informed of the risks (including but not limited to bleeding, infection, respiratory failure, lung injury, tooth/oral injury) and benefits of the procedure and gave consent, see chart.   Pre-op diagnosis: pneumonia Post-op diagnosis: same Estimated blood loss: 30cc  Medications for procedure: see anesthesia notes.   Procedure description: After obtaining informed consent, timeout was called to confirm the patient and the procedure.  The patient was intubated by anesthesia services please see their notes for further details.  The EBUS scope was passed first the right hilar, right paratracheal and subcarinal nodes were scanned.  The right paratracheal and right hilar nodes were difficult to visualize due to adjacent vessels and were very small.  The subcarinal node appeared narrow, but wide enough that sampling could be performed, 2 passes were made. The EBUS scope was then removed, the white light bronchoscope was then passed, an  anatomic tumor was taken of the left lung there was severe changes of chronic bronchitis with enlarged mucous glands seen throughout the left lung.  The right lung mucosa was profoundly abnormal, there was overgrown mucosa at the mainstem carina, there was visible subcutaneous vessels throughout the central right airways extending somewhat into the left central airway.  The mucosa bled easily upon contact with the bronchoscope or with biopsies. The right scope was rinsed into the right lower lobe, transbronchial right lower lobe fluoroscopic guided forceps biopsies were taken.  Transbronchial fluoroscopic guided cytology brush was taken.  BAL was taken and sent for cytology and microbiology. Attempt was made to biopsy the abnormal mucosa seen in the carina with endobronchial biopsy via forceps.  This was taken but bled profusely with approximately 20 cc of blood loss, therefore no further biopsies or samples were taken. Endobronchial epinephrine was applied with ultimate control of the bleeding, overall bleeding was controlled by the end of the procedure. Mucosal secretions were aspiration.     Condition post procedure: stable.    Marda Stalker, MD.  Board Certified in Internal Medicine, Pulmonary Medicine, [place], and Sleep Medicine.   Pulmonary and Critical Care Office Number: [phone]  Patricia Pesa, M.D.  Cheral Marker, M.D  07/24/2017

## 2017-07-24 NOTE — Anesthesia Post-op Follow-up Note (Signed)
Anesthesia QCDR form completed.        

## 2017-07-24 NOTE — Discharge Instructions (Addendum)
Flexible Bronchoscopy, Care After These instructions give you information on caring for yourself after your procedure. Your doctor may also give you more specific instructions. Call your doctor if you have any problems or questions after your procedure. Follow these instructions at home:  Do not eat or drink anything for 2 hours after your procedure. If you try to eat or drink before the medicine wears off, food or drink could go into your lungs. You could also burn yourself.  After 2 hours have passed and when you can cough and gag normally, you may eat soft food and drink liquids slowly.The day after the test, you may eat your normal diet.  You may do your normal activities.  Keep all doctor visits. Get help right away if:  You get more and more short of breath.  You get light-headed.  You feel like you are going to pass out (faint).  You have chest pain.  You have new problems that worry you.  You cough up more than a little blood.  You cough up more blood than before. This information is not intended to replace advice given to you by your health care provider. Make sure you discuss any questions you have with your health care provider. Document Released: 02/24/2009 Document Revised: 10/05/2015 Document Reviewed: 01/01/2013 Elsevier Interactive Patient Education  2017 [place] Anesthesia, Adult, Care After These instructions provide you with information about caring for yourself after your procedure. Your health care provider may also give you more specific instructions. Your treatment has been planned according to current medical practices, but problems sometimes occur. Call your health care provider if you have any problems or questions after your procedure. What can I expect after the procedure? After the procedure, it is common to have:  Vomiting.  A sore throat.  Mental slowness.  It is common to feel:  Nauseous.  Cold or  shivery.  Sleepy.  Tired.  Sore or achy, even in parts of your body where you did not have surgery.  Follow these instructions at home: For at least 24 hours after the procedure:  Do not: ? Participate in activities where you could fall or become injured. ? Drive. ? Use heavy machinery. ? Drink alcohol. ? Take sleeping pills or medicines that cause drowsiness. ? Make important decisions or sign legal documents. ? Take care of children on your own.  Rest. Eating and drinking  If you vomit, drink water, juice, or soup when you can drink without vomiting.  Drink enough fluid to keep your urine clear or pale yellow.  Make sure you have little or no nausea before eating solid foods.  Follow the diet recommended by your health care provider. General instructions  Have a responsible adult stay with you until you are awake and alert.  Return to your normal activities as told by your health care provider. Ask your health care provider what activities are safe for you.  Take over-the-counter and prescription medicines only as told by your health care provider.  If you smoke, do not smoke without supervision.  Keep all follow-up visits as told by your health care provider. This is important. Contact a health care provider if:  You continue to have nausea or vomiting at home, and medicines are not helpful.  You cannot drink fluids or start eating again.  You cannot urinate after 8-12 hours.  You develop a skin rash.  You have fever.  You have increasing redness at the site of your procedure.  Get help right away if:  You have difficulty breathing.  You have chest pain.  You have unexpected bleeding.  You feel that you are having a life-threatening or urgent problem. This information is not intended to replace advice given to you by your health care provider. Make sure you discuss any questions you have with your health care provider. Document Released: 08/05/2000  Document Revised: 10/02/2015 Document Reviewed: 04/13/2015 Elsevier Interactive Patient Education  [employer].

## 2017-07-24 NOTE — Anesthesia Procedure Notes (Signed)
Procedure Name: Intubation Date/Time: 07/24/2017 1:26 PM Performed by: Dionne Bucy, CRNA Pre-anesthesia Checklist: Patient identified, Patient being monitored, Timeout performed, Emergency Drugs available and Suction available Patient Re-evaluated:Patient Re-evaluated prior to induction Oxygen Delivery Method: Circle system utilized Preoxygenation: Pre-oxygenation with 100% oxygen Induction Type: IV induction Ventilation: Mask ventilation without difficulty Laryngoscope Size: Mac and 4 Grade View: Grade I Tube type: Oral Tube size: 8.0 mm Number of attempts: 1 Airway Equipment and Method: Stylet Placement Confirmation: ETT inserted through vocal cords under direct vision,  positive ETCO2 and breath sounds checked- equal and bilateral Secured at: 21 cm Tube secured with: Tape Dental Injury: Teeth and Oropharynx as per pre-operative assessment

## 2017-07-24 NOTE — Progress Notes (Signed)
Coughing up bloody phlem. Drinking and eating without difficulty. Ambulated to the bathroom unassisted. Ready for discharge

## 2017-07-25 ENCOUNTER — Encounter: Payer: Self-pay | Admitting: Internal Medicine

## 2017-07-25 LAB — ACID FAST SMEAR (AFB, MYCOBACTERIA): Acid Fast Smear: NEGATIVE

## 2017-07-25 LAB — ACID FAST SMEAR (AFB)

## 2017-07-25 NOTE — Anesthesia Postprocedure Evaluation (Signed)
Anesthesia Post Note  Patient: Brian Sloan  Procedure(s) Performed: VIDEO BRONCHOSCOPY WITH ENDOBRONCHIAL ULTRASOUND (Right )  Patient location during evaluation: PACU Anesthesia Type: General Level of consciousness: awake and alert Pain management: pain level controlled Vital Signs Assessment: post-procedure vital signs reviewed and stable Respiratory status: spontaneous breathing, nonlabored ventilation and respiratory function stable Cardiovascular status: blood pressure returned to baseline and stable Postop Assessment: no apparent nausea or vomiting Anesthetic complications: no     Last Vitals:  Vitals:   07/24/17 1535 07/24/17 1606  BP: 101/63 108/63  Pulse: (!) 102 90  Resp: 14 12  Temp: 36.5 C   SpO2: 99% 95%    Last Pain:  Vitals:   07/24/17 1535  TempSrc: Temporal  PainSc: 2                  Christia ReadingScott T [name] Anna

## 2017-07-27 LAB — CULTURE, BAL-QUANTITATIVE W GRAM STAIN: Culture: NO GROWTH

## 2017-07-28 LAB — SURGICAL PATHOLOGY

## 2017-07-28 LAB — CYTOLOGY - NON PAP

## 2017-08-04 LAB — VIRUS CULTURE

## 2017-08-05 NOTE — Progress Notes (Signed)
[facility]     Assessment and Plan:  Acute on chronic hemoptysis of uncertain etiology.    Bronchoscopy showed severely friable mucosa, particularly in the central airways of the right lung, which appeared to be prone to bleed very easily. 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 - Patient continues to have daily mild hemoptysis, with continued exertional dyspnea which is progressive. -He was advised in the past that he should have a pneumonectomy but declined, currently is interested in pursuing other interventions which could help his breathing and reduce his hemoptysis.  Will refer to a tertiary care center.  History of methamphetamine abuse -Currently has stopped, last use was approximately 6 months ago.  Return in about 3 months (around 11/06/2017).    Date: 08/05/2017  MRN# [id number] Brian Sloan 01/08/1981   Brian Sloan is a 37 y.o. old male seen in follow up for chief complaint of  Chief Complaint  Patient presents with  . Follow-up     2 weeks post bronch follow up  . Shortness of Breath    with exertion  . Cough    blood to green mucus     HPI:  Patient is following up status post bronchoscopy, he underwent bronchial biopsies and subcarinal lymph node biopsy.  All results are negative for latency, all cultures are negative thus far.  On bronchoscopy was noted that his mucosa was severely abnormal, particularly in the central airways, more so on the right side in the left.  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  His bronchial mucosa bled very easily and profusely upon contacting them with the bronchoscope, which is likely the source of his hemoptysis.  No specific source of bleeding was noted.  All cytology specimens were negative for malignancy, on cultures are negative thus far.   He notes that  his breathing has been ok, he continues to cough up some blood occasionally, he is using advair twice daily, spiriva once daily, he does not feel that are helping too much but a bit. He is coughing up about a quarter sized spot of mucus daily, he takes no blood thinners.  He used to be able to go out of long bike rides of 8-9 miles, and that was his primary mode of transportation. Currently he can ride about 3 miles before he will get winded.   Upon discussion with his mother she noted that several years ago he was told that he should have a right pneumonectomy, but at that time he declined.   Medication:    Current Outpatient Medications:  .  acetaminophen (TYLENOL) 325 MG tablet, Take 1 tablet (325 mg total) by mouth every 6 (six) hours as needed for mild pain (or Fever >/= 101)., Disp: , Rfl:  .  albuterol (PROVENTIL HFA;VENTOLIN HFA) 108 (90 Base) MCG/ACT inhaler, Inhale 2 puffs into the lungs every 6 (six) hours as needed for wheezing or shortness of breath., Disp: 1 Inhaler, Rfl: 0 .  FLUoxetine (PROZAC) 20 MG capsule, Take 1 capsule by mouth every morning. , Disp: , Rfl:  .  Fluticasone-Salmeterol (ADVAIR) 250-50 MCG/DOSE AEPB, Inhale 1 puff into the lungs 2 (two) times daily., Disp: 60 each, Rfl: 3 .  gabapentin (NEURONTIN) 300 MG capsule, Take 300 mg by mouth 2 (two) times daily., Disp: , Rfl:  .  montelukast (SINGULAIR) 10 MG tablet, Take 10 mg by  mouth every morning. , Disp: , Rfl:  .  tiotropium (SPIRIVA HANDIHALER) 18 MCG inhalation capsule, Place 1 capsule (18 mcg total) into inhaler and inhale daily. (Patient taking differently: Place 18 mcg into inhaler and inhale every morning. ), Disp: 30 capsule, Rfl: 0   Allergies:  Patient has no known allergies.  Review of Systems: Gen:  Denies  fever, sweats. HEENT: Denies blurred vision. Cvc:  No dizziness, chest pain or heaviness Resp:   Denies cough or sputum porduction. Gi: Denies swallowing difficulty, stomach pain. constipation,  bowel incontinence Gu:  Denies bladder incontinence, burning urine Ext:   No Joint pain, stiffness. Skin: No skin rash, easy bruising. Endoc:  No polyuria, polydipsia. Psych: No depression, insomnia. Other:  All other systems were reviewed and found to be negative other than what is mentioned in the HPI.   Physical Examination:   VS: BP 102/72 (BP Location: Left Arm, Cuff Size: Normal)   Pulse 87   Resp 16   Ht 5\' 8"  (1.727 m)   Wt 137 lb (62.1 kg)   SpO2 98%   BMI 20.83 kg/m    General Appearance: No distress  Neuro:without focal findings,  speech normal,  HEENT: PERRLA, EOM intact. Pulmonary: normal breath sounds, No wheezing.   CardiovascularNormal S1,S2.  No m/r/g.   Abdomen: Benign, Soft, non-tender. Renal:  No costovertebral tenderness  GU:  Not performed at this time. Endoc: No evident thyromegaly, no signs of acromegaly. Skin:   warm, no rash. Extremities: normal, no cyanosis, clubbing.   LABORATORY PANEL:   CBC No results for input(s): WBC, HGB, HCT, PLT in the last 168 hours. ------------------------------------------------------------------------------------------------------------------  Chemistries  No results for input(s): NA, K, CL, CO2, GLUCOSE, BUN, CREATININE, CALCIUM, MG, AST, ALT, ALKPHOS, BILITOT in the last 168 hours.  Invalid input(s): GFRCGP ------------------------------------------------------------------------------------------------------------------  Cardiac Enzymes No results for input(s): TROPONINI in the last 168 hours. ------------------------------------------------------------  RADIOLOGY:   No results found for this or any previous visit. Results for orders placed during the hospital encounter of 06/25/17  DG Chest 2 View   Narrative CLINICAL DATA:  37 year old male with cough productive of bloody sputum for several days. Smoker.  EXAM: CHEST  2 VIEW  COMPARISON:  None.  FINDINGS: Volume loss in the right lung with  widespread reticular and irregular right lung pulmonary opacity. Confluent right apical opacity which may reflect pleural/parenchymal scarring. Areas of architectural distortion suspected including about the right hilum.  Mild rightward shift of the mediastinum. Overall mediastinal contours remain within normal limits. Mild rightward traction on the trachea. Larger left lung volume. The left lung appears clear. No acute osseous abnormality identified. Paucity of bowel gas in the upper abdomen.  IMPRESSION: No prior study for comparison. Diffusely abnormal right lung with volume loss, widespread irregular opacity, and probable architectural distortion. These changes are age indeterminate, and widespread right lung infection is difficult to exclude. No pleural effusion.   Electronically Signed   By: Odessa FlemingH  Hall M.D.   On: 06/25/2017 23:15    ------------------------------------------------------------------------------------------------------------------  Thank  you for allowing [facility] to assist in the care of your patient. Our recommendations are noted above.  Please contact us if we can be of further service.   Wells Guileseep Anibal Quinby, MD.  [facility] Office Number: [phone]  Santiago Gladavid Kasa, M.D.  Billy Fischeravid Simonds, M.D  08/05/2017

## 2017-08-06 ENCOUNTER — Encounter: Payer: Self-pay | Admitting: Internal Medicine

## 2017-08-06 ENCOUNTER — Ambulatory Visit (INDEPENDENT_AMBULATORY_CARE_PROVIDER_SITE_OTHER): Payer: Self-pay | Admitting: Internal Medicine

## 2017-08-06 VITALS — BP 102/72 | HR 87 | Resp 16 | Ht 68.0 in | Wt 137.0 lb

## 2017-08-06 DIAGNOSIS — R042 Hemoptysis: Secondary | ICD-10-CM

## 2017-08-06 DIAGNOSIS — Q336 Congenital hypoplasia and dysplasia of lung: Secondary | ICD-10-CM

## 2017-08-06 NOTE — Patient Instructions (Signed)
Will get in touch with you over the next few weeks, about referral to a tertiary center.

## 2017-08-11 ENCOUNTER — Inpatient Hospital Stay: Payer: Medicaid Other | Admitting: Internal Medicine

## 2017-08-11 LAB — ACID FAST CULTURE WITH REFLEXED SENSITIVITIES: ACID FAST CULTURE - AFSCU3: NEGATIVE

## 2017-08-11 LAB — ACID FAST CULTURE WITH REFLEXED SENSITIVITIES (MYCOBACTERIA)

## 2017-08-13 LAB — ACID FAST CULTURE WITH REFLEXED SENSITIVITIES

## 2017-08-13 LAB — ACID FAST CULTURE WITH REFLEXED SENSITIVITIES (MYCOBACTERIA): Acid Fast Culture: NEGATIVE

## 2017-08-14 ENCOUNTER — Ambulatory Visit: Payer: Self-pay | Admitting: Internal Medicine

## 2017-08-14 LAB — CULTURE, FUNGUS WITHOUT SMEAR

## 2017-08-14 LAB — ACID FAST CULTURE WITH REFLEXED SENSITIVITIES (MYCOBACTERIA): Acid Fast Culture: NEGATIVE

## 2017-08-14 LAB — ACID FAST CULTURE WITH REFLEXED SENSITIVITIES

## 2017-09-05 LAB — ACID FAST CULTURE WITH REFLEXED SENSITIVITIES: ACID FAST CULTURE - AFSCU3: NEGATIVE

## 2018-01-05 ENCOUNTER — Encounter: Payer: Self-pay | Admitting: Internal Medicine

## 2018-01-05 ENCOUNTER — Ambulatory Visit (INDEPENDENT_AMBULATORY_CARE_PROVIDER_SITE_OTHER): Payer: PRIVATE HEALTH INSURANCE | Admitting: Internal Medicine

## 2018-01-05 VITALS — BP 102/66 | HR 91 | Resp 16 | Ht 68.0 in | Wt 131.0 lb

## 2018-01-05 DIAGNOSIS — F17210 Nicotine dependence, cigarettes, uncomplicated: Secondary | ICD-10-CM | POA: Diagnosis not present

## 2018-01-05 DIAGNOSIS — R042 Hemoptysis: Secondary | ICD-10-CM | POA: Diagnosis not present

## 2018-01-05 DIAGNOSIS — J42 Unspecified chronic bronchitis: Secondary | ICD-10-CM | POA: Diagnosis not present

## 2018-01-05 DIAGNOSIS — Q336 Congenital hypoplasia and dysplasia of lung: Secondary | ICD-10-CM

## 2018-01-05 MED ORDER — FLUTICASONE FUROATE-VILANTEROL 200-25 MCG/INH IN AEPB: 1.0000 | INHALATION_SPRAY | Freq: Every day | RESPIRATORY_TRACT | 5 refills | Status: AC

## 2018-01-05 NOTE — Progress Notes (Signed)
[facility]* [facility]     Assessment and Plan:  Severe chronic bronchitis with occasional hemoptysis. Status post bronchoscopy with severely friable right lung mucosa. 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 - Occasional hemoptysis, which is mild and improved from previous. -We will start Breo, given coupon.  History of methamphetamine abuse -Currently has stopped, last use was approximately 6 months ago.  Nicotine abuse. - Discussed importance of smoke cessation, he is going to continue to try to cut down.  Spent 3 minutes of discussion.  No follow-ups on file.    Date: 01/05/2018  MRN# [id number] Brian Sloan 01/25/1981   Brian Sloan is a 37 y.o. old male seen in follow up for chief complaint of  Chief Complaint  Patient presents with  . Follow-up    pt has been cough up blood x 1 week bright red  to green  . Shortness of Breath    with and without exertion  . Wheezing     HPI:  Patient is a 37 year old male with a history of pulmonary hypoplasia of the right lung, severe chronic bronchitis with chronic cough and episodes of occasional hemoptysis.  Previous bronchoscopy showed severely friable mucosa in the central, particularly right mainstem airways. Despite his respiratory disease, he has good tolerance of exercise. He unfortunately continues to smoke. He is smoking less than half ppd. He works at [employer] in [place], no dust in the air.  They have a dog, not in his bedroom.   Today he notes that he continues to have a cough of phlegm and occasional spots of blood. He has not yet got medications due to lack of insurance.   Medication:    Current Outpatient Medications:  .  acetaminophen (TYLENOL) 325 MG tablet, Take 1 tablet (325 mg total) by mouth every 6 (six) hours as needed for mild pain (or Fever >/= 101)., Disp: , Rfl:  .  albuterol (PROVENTIL HFA;VENTOLIN HFA) 108 (90 Base)  MCG/ACT inhaler, Inhale 2 puffs into the lungs every 6 (six) hours as needed for wheezing or shortness of breath., Disp: 1 Inhaler, Rfl: 0 .  FLUoxetine (PROZAC) 20 MG capsule, Take 1 capsule by mouth every morning. , Disp: , Rfl:  .  Fluticasone-Salmeterol (ADVAIR) 250-50 MCG/DOSE AEPB, Inhale 1 puff into the lungs 2 (two) times daily., Disp: 60 each, Rfl: 3 .  gabapentin (NEURONTIN) 300 MG capsule, Take 300 mg by mouth 2 (two) times daily., Disp: , Rfl:  .  montelukast (SINGULAIR) 10 MG tablet, Take 10 mg by mouth every morning. , Disp: , Rfl:  .  tiotropium (SPIRIVA HANDIHALER) 18 MCG inhalation capsule, Place 1 capsule (18 mcg total) into inhaler and inhale daily. (Patient taking differently: Place 18 mcg into inhaler and inhale every morning. ), Disp: 30 capsule, Rfl: 0   Allergies:  Patient has no known allergies.  Review of Systems:  Constitutional: Feels well. Cardiovascular: No chest pain.  Pulmonary: Denies dyspnea.   The remainder of systems were reviewed and were found to be negative other than what is documented in the HPI.    Physical Examination:   VS: BP 102/66 (BP Location: Left Arm, Cuff Size: Normal)   Pulse 91   Resp 16   Ht 5\' 8"  (1.727 m)   Wt 131 lb (59.4 kg)   SpO2 98%   BMI 19.92 kg/m   General Appearance: No distress  Neuro:without focal findings, mental status, speech normal, alert and oriented  HEENT: PERRLA, EOM intact Pulmonary: No wheezing, No rales  CardiovascularNormal S1,S2.  No m/r/g.  Abdomen: Benign, Soft, non-tender, No masses Renal:  No costovertebral tenderness  GU:  No performed at this time. Endoc: No evident thyromegaly, no signs of acromegaly or Cushing features Skin:   warm, no rashes, no ecchymosis  Extremities: normal, no cyanosis, clubbing.     LABORATORY PANEL:   CBC No results for input(s): WBC, HGB, HCT, PLT in the last 168  hours. ------------------------------------------------------------------------------------------------------------------  Chemistries  No results for input(s): NA, K, CL, CO2, GLUCOSE, BUN, CREATININE, CALCIUM, MG, AST, ALT, ALKPHOS, BILITOT in the last 168 hours.  Invalid input(s): GFRCGP ------------------------------------------------------------------------------------------------------------------  Cardiac Enzymes No results for input(s): TROPONINI in the last 168 hours. ------------------------------------------------------------  RADIOLOGY:   No results found for this or any previous visit. Results for orders placed during the hospital encounter of 06/25/17  DG Chest 2 View   Narrative CLINICAL DATA:  37 year old male with cough productive of bloody sputum for several days. Smoker.  EXAM: CHEST  2 VIEW  COMPARISON:  None.  FINDINGS: Volume loss in the right lung with widespread reticular and irregular right lung pulmonary opacity. Confluent right apical opacity which may reflect pleural/parenchymal scarring. Areas of architectural distortion suspected including about the right hilum.  Mild rightward shift of the mediastinum. Overall mediastinal contours remain within normal limits. Mild rightward traction on the trachea. Larger left lung volume. The left lung appears clear. No acute osseous abnormality identified. Paucity of bowel gas in the upper abdomen.  IMPRESSION: No prior study for comparison. Diffusely abnormal right lung with volume loss, widespread irregular opacity, and probable architectural distortion. These changes are age indeterminate, and widespread right lung infection is difficult to exclude. No pleural effusion.   Electronically Signed   By: Odessa Fleming M.D.   On: 06/25/2017 23:15    ------------------------------------------------------------------------------------------------------------------  Thank  you for allowing [facility], Critical Care to assist in the care of your patient. Our recommendations are noted above.  Please contact us if we can be of further service.  Wells Guiles, M.D., F.C.C.P.  Board Certified in Internal Medicine, Pulmonary Medicine, Critical Care Medicine, and Sleep Medicine.  [place] [facility] Office Number: [phone]   01/05/2018

## 2018-01-05 NOTE — Patient Instructions (Signed)
Will start Breo once daily, rinse mouth after use.

## 2018-05-20 IMAGING — CT CT CHEST W/ CM
2 of 3 series · 15 of 36 positions shown, 18 images · IV contrast (iopamidol)
Comparison: CTA chest dated June 26, 2017.

CLINICAL DATA: Recurrent hemoptysis.

EXAM:
CT CHEST WITH CONTRAST
TECHNIQUE: Multidetector CT imaging of the chest was performed during
intravenous contrast administration.
CONTRAST:  75mL XCO5D5-7HH IOPAMIDOL ([id number]) INJECTION 61%

[Series 2: axial st · axial · 0.64mm/px · z∈[-331,-47]mm · 12 of 168 slices shown, 15 images]
[im 13/168  mediastinal]
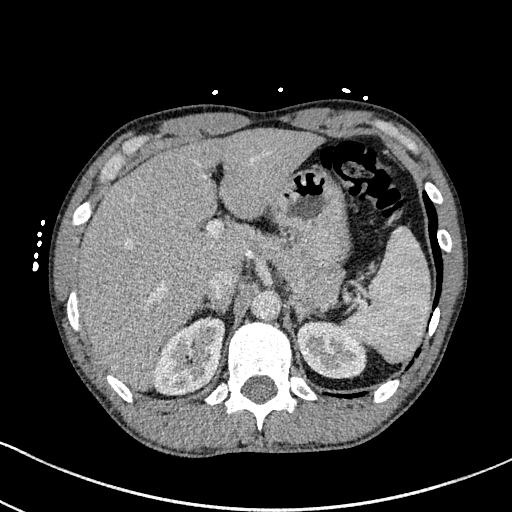
[im 13/168  lung]
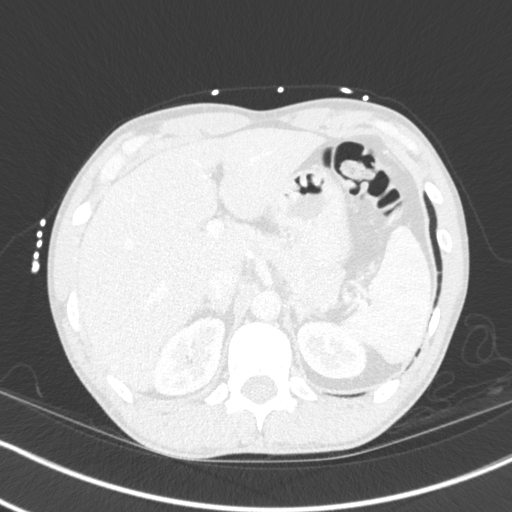
[im 25/168  lung]
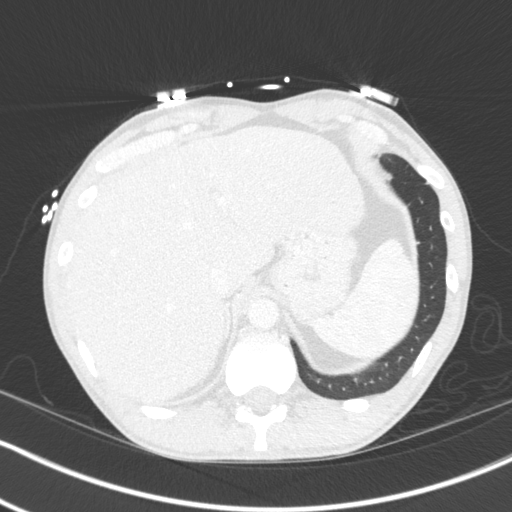
[im 38/168  lung]
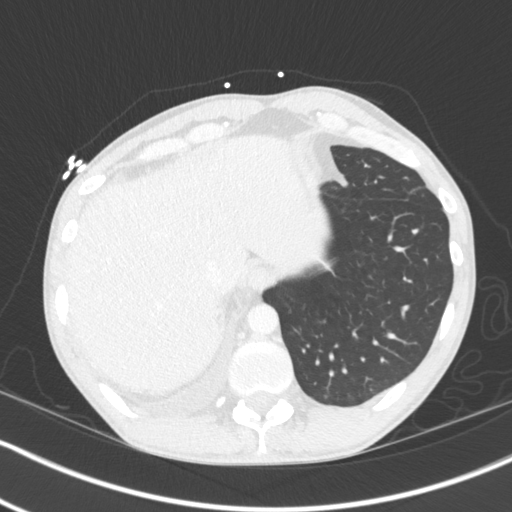
[im 50/168  lung]
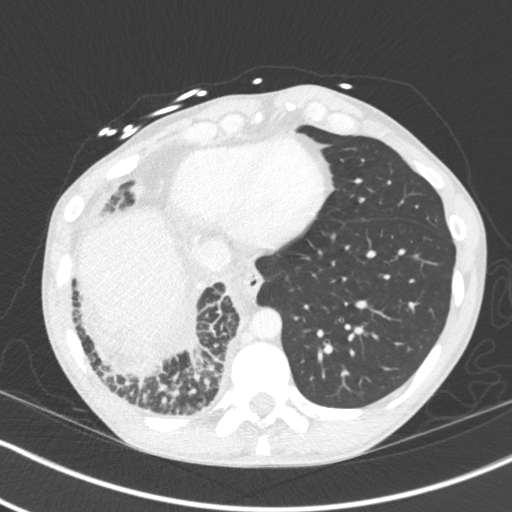
[im 62/168  mediastinal]
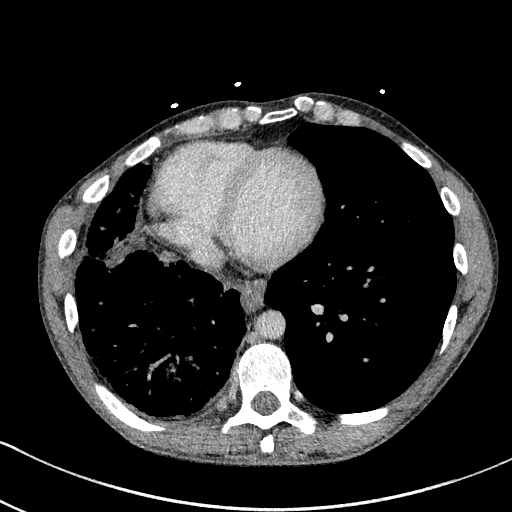
[im 62/168  lung]
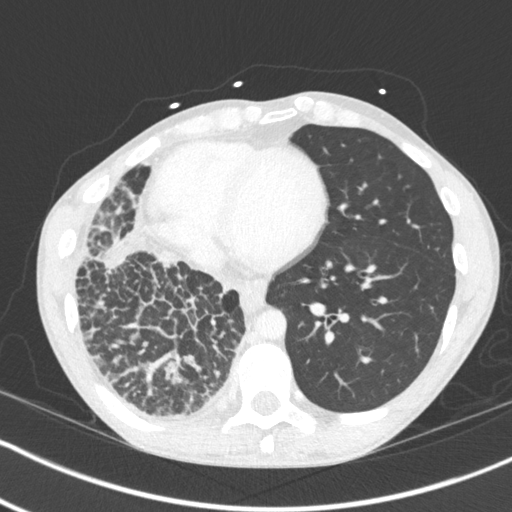
[im 75/168  lung]
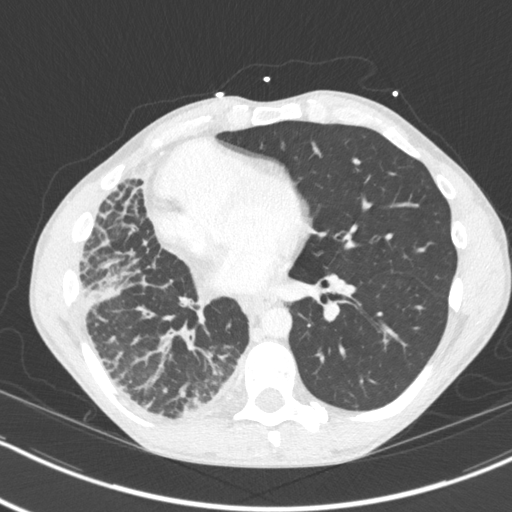
[im 93/168  lung]
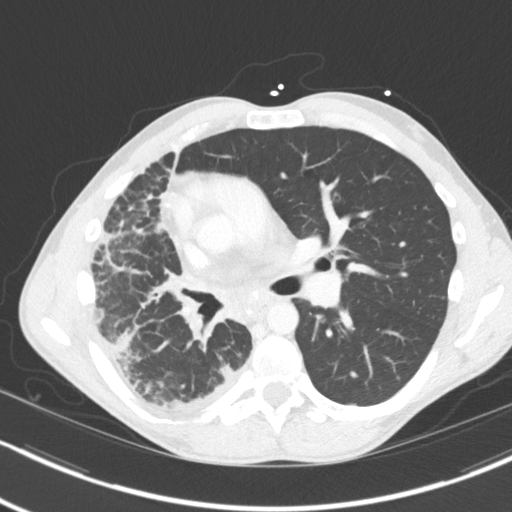
[im 106/168  lung]
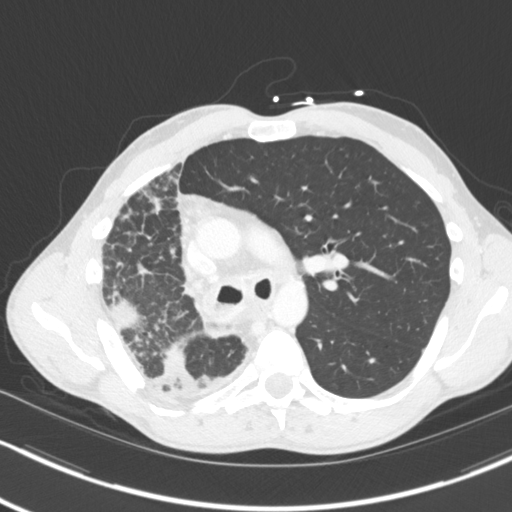
[im 118/168  mediastinal]
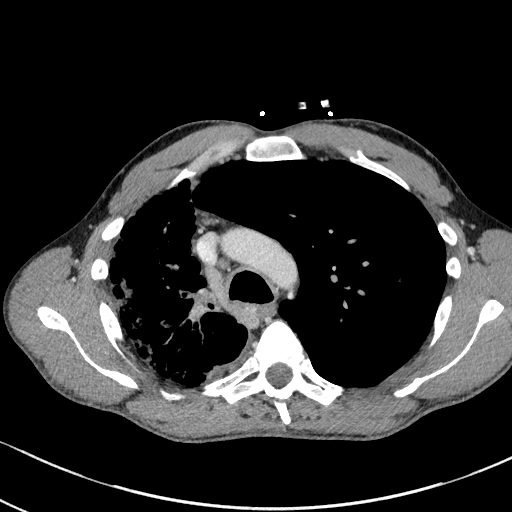
[im 118/168  lung]
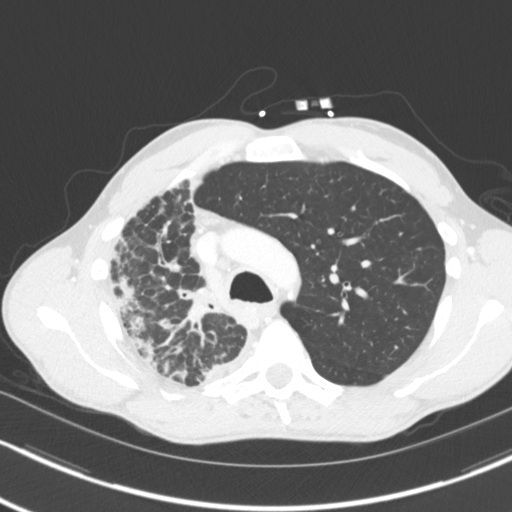
[im 130/168  lung]
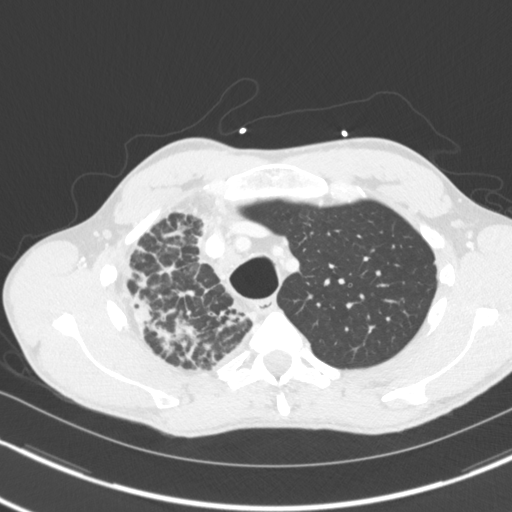
[im 143/168  lung]
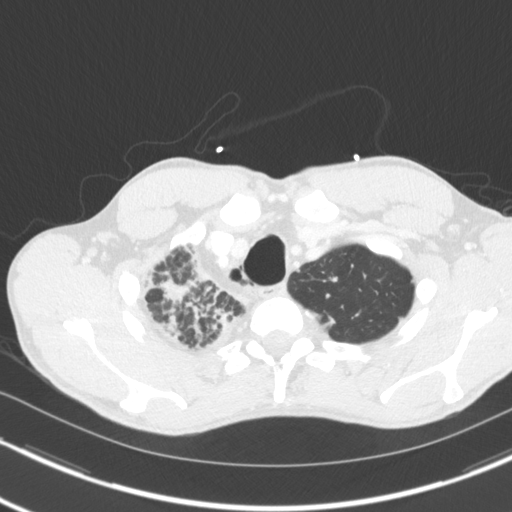
[im 155/168  lung]
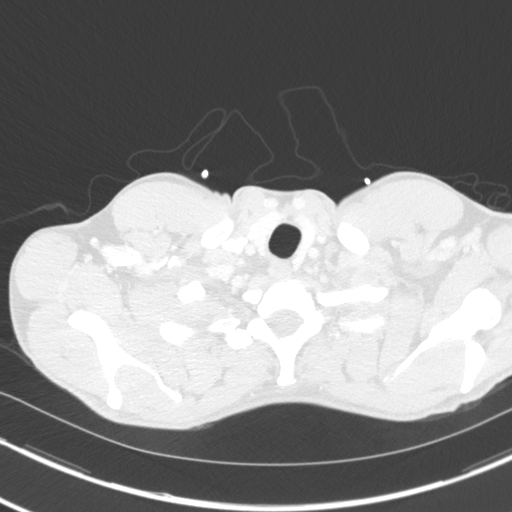

[Series 5: coronal · coronal · 0.59mm/px · 3 of 125 slices shown]
[im 25/125  lung]
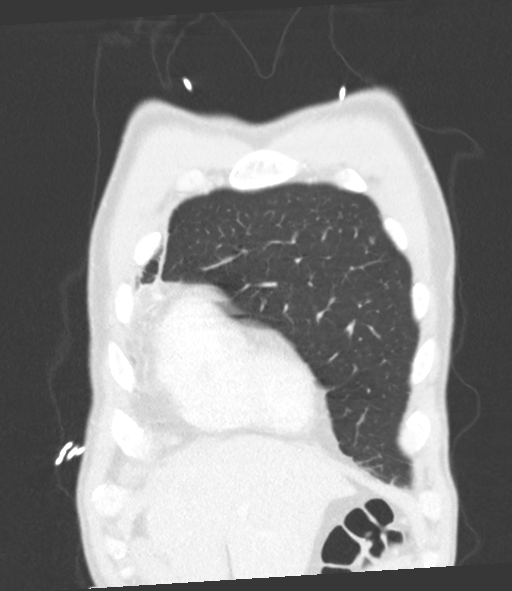
[im 50/125  lung]
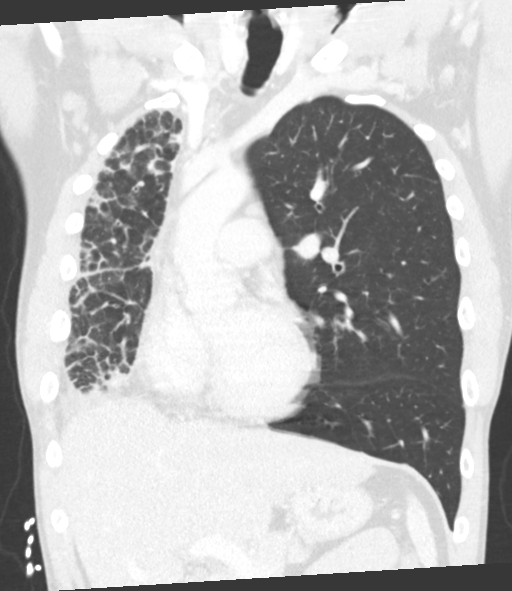
[im 75/125  lung]
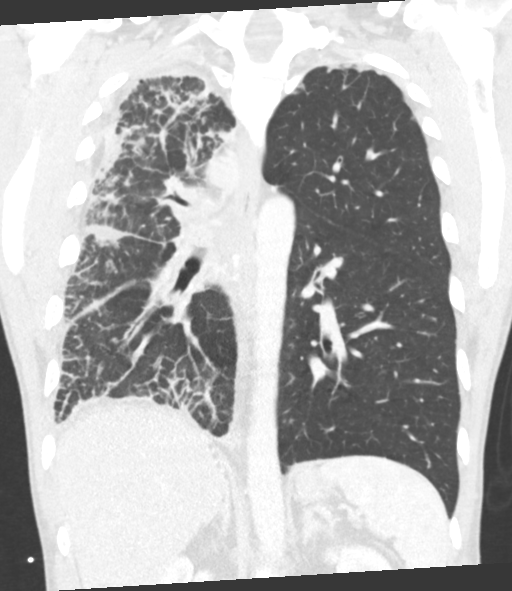

[15 of 36 positions shown; findings below may reference images not displayed]

FINDINGS: Cardiovascular: Normal heart size. No pericardial effusion. Normal
caliber thoracic aorta. The right pulmonary arterial and venous
system remains attenuated, similar to prior study. Tortuous,
hypertrophy bronchial arteries are again noted arising from the
aorta.

Mediastinum/Nodes: Slight interval decrease in size of the right
upper paratracheal lymph node, now measuring 13 mm in short axis,
previously 19 mm. Borderline enlarged prevascular and subcarinal
lymph nodes are unchanged. The mediastinum remains shifted to the
right. The thyroid gland and esophagus are unremarkable.

Lungs/Pleura: Unchanged volume loss in the right lung with
hyperinflation of the left lung. Persistent asymmetric inter- and
intralobular septal reticulonodular thickening, also involving the
major and minor fissures, with scattered subpleural alveolar
opacities and prominent peribronchial thickening throughout the
right lung. This is similar appearance to prior study. Trace right
pleural effusion. Small areas of mucous impaction in the left apex
are unchanged. Small pulmonary nodules in the left upper lobe
measuring 5-6 mm are unchanged and may represent mucous impaction.

Upper Abdomen: No acute abnormality.

Musculoskeletal: No chest wall abnormality. No acute or significant
osseous findings.
IMPRESSION: 1. Unchanged chronic volume loss in the right lung with
reticulonodular intra- and interlobular septal thickening in a
perilymphatic distribution. Given the unilateral involvement of the
right lung with attenuation of the right pulmonary arterial and
venous system, the patient may have suffered an early developmental
insult and developed secondary chronic organizing pneumonia or
atypical infection. Given the additional mediastinal adenopathy,
lymphangitic carcinomatosis and sarcoidosis also remain in the
differential. Pulmonary consultation, short-term follow-up chest CT
in 3 months, and comparison with any prior outside imaging is
recommended.
2. Tortuous, hypertrophied bronchial arteries again noted arising
from the aorta.

## 2018-05-24 IMAGING — DX DG CHEST 1V
1 series · 1 of 1 positions shown · non-contrast
Comparison: Chest CT 07/20/2017 and earlier.

CLINICAL DATA: 36-year-old male status post bronchoscopic biopsy.

EXAM:
CHEST  1 VIEW

[chest ap]
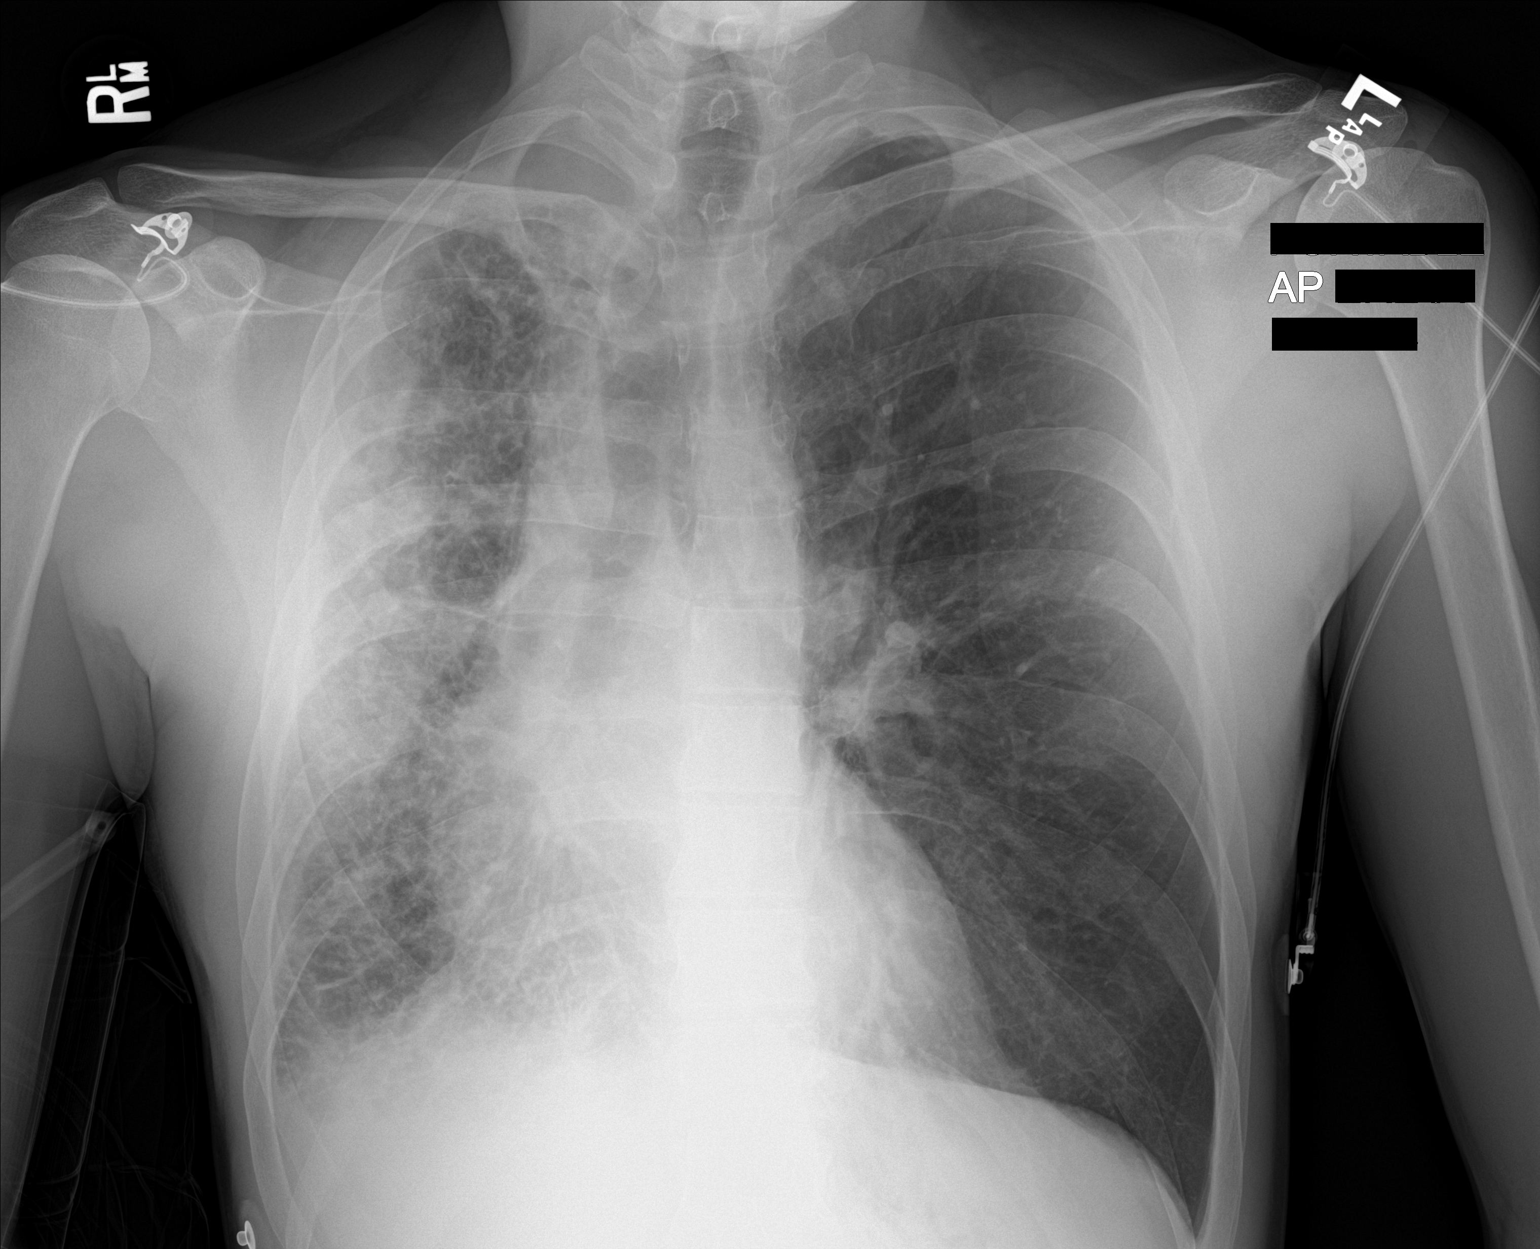

[1 of 1 positions shown; findings below may reference images not displayed]

FINDINGS: Portable AP upright view at 1033 hours. Stable lung volumes. No
pneumothorax. Increased hazy opacity in the periphery of the right
upper lung superimposed on pre-existing widespread abnormal course
right lung opacity. Small right pleural effusion suspected and not
significantly changed. The left lung appears clear. Stable cardiac
size and mediastinal contours. No acute osseous abnormality
identified.
IMPRESSION: No adverse features identified status post bronchoscopic biopsy.
Increased hazy opacity in the periphery of the right lung might
reflect sequelae of bronchoalveolar lavage.
